# Patient Record
Sex: Male | Born: 1980 | Hispanic: Yes | Marital: Single | Smoking: Current some day smoker
Health system: Southern US, Community
[De-identification: ages and names within clinical notes are randomized; demographics above are authoritative.]

## PROBLEM LIST (undated history)

## (undated) DIAGNOSIS — I10 Essential (primary) hypertension: Secondary | ICD-10-CM

## (undated) DIAGNOSIS — E119 Type 2 diabetes mellitus without complications: Secondary | ICD-10-CM

## (undated) DIAGNOSIS — K859 Acute pancreatitis without necrosis or infection, unspecified: Secondary | ICD-10-CM

## (undated) DIAGNOSIS — E785 Hyperlipidemia, unspecified: Secondary | ICD-10-CM

## (undated) DIAGNOSIS — L409 Psoriasis, unspecified: Secondary | ICD-10-CM

## (undated) HISTORY — DX: Psoriasis, unspecified: L40.9

## (undated) HISTORY — PX: NO PAST SURGERIES: SHX2092

---

## 2008-08-14 ENCOUNTER — Emergency Department: Payer: Self-pay | Admitting: Emergency Medicine

## 2011-06-16 ENCOUNTER — Emergency Department: Payer: Self-pay | Admitting: Internal Medicine

## 2011-06-16 LAB — TROPONIN I: Troponin-I: <0.02 ng/mL

## 2011-06-16 LAB — BASIC METABOLIC PANEL
Anion Gap: 8 (ref 7–16)
Calcium, Total: 9.6 mg/dL (ref 8.5–10.1)
Co2: 25 mmol/L (ref 21–32)
EGFR (African American): >60
EGFR (Non-African Amer.): >60
Osmolality: 279 (ref 275–301)

## 2011-06-16 LAB — CBC
HCT: 48.6 % (ref 40.0–52.0)
HGB: 16.1 g/dL (ref 13.0–18.0)
MCHC: 33.2 g/dL (ref 32.0–36.0)
MCV: 90 fL (ref 80–100)
Platelet: 311 10*3/uL (ref 150–440)
RDW: 13.6 % (ref 11.5–14.5)
WBC: 9.9 10*3/uL (ref 3.8–10.6)

## 2015-10-28 ENCOUNTER — Encounter: Payer: Self-pay | Admitting: *Deleted

## 2015-10-28 ENCOUNTER — Emergency Department
Admission: EM | Admit: 2015-10-28 | Discharge: 2015-10-28 | Disposition: A | Discharge status: Home / Self Care | Payer: Self-pay | Attending: Emergency Medicine | Admitting: Emergency Medicine

## 2015-10-28 DIAGNOSIS — F17200 Nicotine dependence, unspecified, uncomplicated: Secondary | ICD-10-CM | POA: Insufficient documentation

## 2015-10-28 DIAGNOSIS — L089 Local infection of the skin and subcutaneous tissue, unspecified: Secondary | ICD-10-CM | POA: Insufficient documentation

## 2015-10-28 DIAGNOSIS — L409 Psoriasis, unspecified: Secondary | ICD-10-CM | POA: Insufficient documentation

## 2015-10-28 DIAGNOSIS — B9689 Other specified bacterial agents as the cause of diseases classified elsewhere: Secondary | ICD-10-CM

## 2015-10-28 MED ORDER — BETAMETHASONE DIPROPIONATE 0.05 % EX CREA: TOPICAL_CREAM | Freq: Two times a day (BID) | CUTANEOUS | 0 refills | Status: DC

## 2015-10-28 MED ORDER — SULFAMETHOXAZOLE-TRIMETHOPRIM 800-160 MG PO TABS: 1.0000 | ORAL_TABLET | Freq: Two times a day (BID) | ORAL | 0 refills | Status: DC

## 2015-10-28 NOTE — ED Provider Notes (Signed)
[facility] Emergency Department Provider Note  ____________________________________________  Time seen: Approximately 11:21 AM  I have reviewed the triage vital signs and the nursing notes.   HISTORY  Chief Complaint Rash    HPI Patrick Wright is a 35 y.o. male , NAD, presents emergency Department with chronic history of psoriasis. States he has psoriatic plaques about his knees, elbows and fingers. Has had pain about the lesions as they had been cracking with normal ambulation as well as during work at his job. 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 Has had no swelling joints or pain in the joints. Denies fevers, chills, body aches. Has not noted any numbness, weakness, tingling. No injuries or traumas to his extremities.   History reviewed. No pertinent past medical history.  There are no active problems to display for this patient.   History reviewed. No pertinent surgical history.  Prior to Admission medications   Medication Sig Start Date End Date Taking? Authorizing Provider  betamethasone dipropionate (DIPROLENE) 0.05 % cream Apply topically 2 (two) times daily. 10/28/15   Jami L Hagler, PA-C  sulfamethoxazole-trimethoprim (BACTRIM DS,SEPTRA DS) 800-160 MG tablet Take 1 tablet by mouth 2 (two) times daily. 10/28/15   Jami L Hagler, PA-C    Allergies Review of patient's allergies indicates no known allergies.  No family history on file.  Social History Social History  Substance Use Topics  . Smoking status: Current Every Day Smoker  . Smokeless tobacco: Not on file  . Alcohol use Yes     Review of Systems  Constitutional: No fever/chills Musculoskeletal: Negative for general myalgias or joint swelling.  Skin: Positive skin sores and rash elbows, knees, fingers. No oozing or weeping,  bleeding. Neurological: Negative for numbness, weakness, tingling. 10-point ROS otherwise negative.  ____________________________________________   PHYSICAL EXAM:  VITAL SIGNS: ED Triage Vitals  Enc Vitals Group     BP 10/28/15 1042 (!) 136/100     Pulse Rate 10/28/15 1042 69     Resp 10/28/15 1042 18     Temp 10/28/15 1042 98 F (36.7 C)     Temp Source 10/28/15 1042 Oral     SpO2 10/28/15 1042 99 %     Weight 10/28/15 1043 165 lb (74.8 kg)     Height 10/28/15 1043 5' 2"  (1.575 m)     Head Circumference --      Peak Flow --      Pain Score 10/28/15 1043 8     Pain Loc --      Pain Edu? --      Excl. in [place]? --      Constitutional: Alert and oriented. Well appearing and in no acute distress. Eyes: Conjunctivae are normal.  Head: Atraumatic. Cardiovascular: Good peripheral circulation with 2+ pulses noted in bilateral upper and lower extremities. Respiratory: Normal respiratory effort without tachypnea or retractions.  Musculoskeletal: Full range of motion of bilateral upper and lower extremities without pain or difficulty. No swelling about the fingers, hands, knees, elbows. No lower extremity tenderness nor edema.  No joint effusions. Neurologic:  Normal speech and language. No gross focal neurologic deficits are appreciated.  Skin:  Psoriatic plaques noted about the extensor surfaces of bilateral knees, elbows as well as the joints of the fingers. Plaques about the right elbow and bilateral knees are approximately half  a centimeter thick with some centralized cracking with yellow crusted lesion. No active oozing or bleeding. No tenderness to palpation. Surrounding skin is without erythema, abnormal warmth, fluctuance or induration. Skin is warm, dry. Psychiatric: Mood and affect are normal. Speech and behavior are normal. Patient exhibits appropriate insight and judgement.   ____________________________________________    LABS  None ____________________________________________  EKG  None ____________________________________________  RADIOLOGY  None ____________________________________________    PROCEDURES  Procedure(s) performed: None   Procedures   Medications - No data to display   ____________________________________________   INITIAL IMPRESSION / ASSESSMENT AND PLAN / ED COURSE  Pertinent labs & imaging results that were available during my care of the patient were reviewed by me and considered in my medical decision making (see chart for details).  Clinical Course    Patient's diagnosis is consistent with Psoriasis with localized bacterial skin infection. Patient will be discharged home with prescriptions for betamethasone cream and Bactrim DS to take as directed. Patient is to follow-up with [facility] or [facility] dermatology for further evaluation and treatment of chronic psoriasis. It was discussed with patient that it can take a lot of time for psoriasis to decrease in size in thickness and that if he is noncompliant with medical regimens that will only continue to worsen.  Patient is given ED precautions to return to the ED for any worsening or new symptoms.      ____________________________________________  FINAL CLINICAL IMPRESSION(S) / ED DIAGNOSES  Final diagnoses:  Psoriasis  Localized bacterial infection of skin      NEW MEDICATIONS STARTED DURING THIS VISIT:  New Prescriptions   BETAMETHASONE DIPROPIONATE (DIPROLENE) 0.05 % CREAM    Apply topically 2 (two) times daily.   SULFAMETHOXAZOLE-TRIMETHOPRIM (BACTRIM DS,SEPTRA DS) 800-160 MG TABLET    Take 1 tablet by mouth 2 (two) times daily.         Braxton Feathers, PA-C 10/28/15 1138    Earleen [place], MD 10/28/15 1416

## 2015-10-28 NOTE — ED Notes (Signed)
Red scaled patches of skin on bilat elbows and bilat knees and knuckles.  Reports itching and pain.  Has been going on x 1 year. Got a cream from dermatologist 6 months ago but did not have relief. Skin otherwise w/d. Lungs clear.

## 2015-10-28 NOTE — ED Triage Notes (Signed)
Pt has psoriasis on bilateral elbows, hands and knees, pt reports pain in knee and elbow

## 2017-05-23 ENCOUNTER — Other Ambulatory Visit: Payer: Self-pay

## 2017-05-23 ENCOUNTER — Encounter: Payer: Self-pay | Admitting: Emergency Medicine

## 2017-05-23 ENCOUNTER — Inpatient Hospital Stay
Admission: EM | Admit: 2017-05-23 | Discharge: 2017-05-25 | DRG: 638 | Disposition: A | Discharge status: Home / Self Care | Payer: Self-pay | Attending: Internal Medicine | Admitting: Internal Medicine

## 2017-05-23 DIAGNOSIS — Z87891 Personal history of nicotine dependence: Secondary | ICD-10-CM

## 2017-05-23 DIAGNOSIS — E1310 Other specified diabetes mellitus with ketoacidosis without coma: Secondary | ICD-10-CM

## 2017-05-23 DIAGNOSIS — E1110 Type 2 diabetes mellitus with ketoacidosis without coma: Principal | ICD-10-CM

## 2017-05-23 DIAGNOSIS — R748 Abnormal levels of other serum enzymes: Secondary | ICD-10-CM | POA: Diagnosis present

## 2017-05-23 DIAGNOSIS — K7581 Nonalcoholic steatohepatitis (NASH): Secondary | ICD-10-CM | POA: Diagnosis present

## 2017-05-23 DIAGNOSIS — E785 Hyperlipidemia, unspecified: Secondary | ICD-10-CM | POA: Diagnosis present

## 2017-05-23 DIAGNOSIS — E871 Hypo-osmolality and hyponatremia: Secondary | ICD-10-CM | POA: Diagnosis present

## 2017-05-23 DIAGNOSIS — R1084 Generalized abdominal pain: Secondary | ICD-10-CM

## 2017-05-23 DIAGNOSIS — Z720 Tobacco use: Secondary | ICD-10-CM

## 2017-05-23 DIAGNOSIS — R17 Unspecified jaundice: Secondary | ICD-10-CM

## 2017-05-23 HISTORY — DX: Type 2 diabetes mellitus without complications: E11.9

## 2017-05-23 HISTORY — DX: Hyperlipidemia, unspecified: E78.5

## 2017-05-23 LAB — BLOOD GAS, VENOUS
Acid-base deficit: 8.3 mmol/L — ABNORMAL HIGH (ref 0.0–2.0)
Bicarbonate: 17.2 mmol/L — ABNORMAL LOW (ref 20.0–28.0)
O2 Saturation: 51.6 %
PATIENT TEMPERATURE: 37
pCO2, Ven: 35 mmHg — ABNORMAL LOW (ref 44.0–60.0)
pH, Ven: 7.3 (ref 7.250–7.430)

## 2017-05-23 LAB — URINALYSIS, COMPLETE (UACMP) WITH MICROSCOPIC
BACTERIA UA: NONE SEEN
BILIRUBIN URINE: NEGATIVE
Glucose, UA: >=500 mg/dL — AB
HGB URINE DIPSTICK: NEGATIVE
Ketones, ur: 80 mg/dL — AB
LEUKOCYTES UA: NEGATIVE
NITRITE: NEGATIVE
PROTEIN: NEGATIVE mg/dL
RBC / HPF: NONE SEEN RBC/hpf (ref 0–5)
Specific Gravity, Urine: 1.029 (ref 1.005–1.030)
Squamous Epithelial / LPF: NONE SEEN
pH: 5 (ref 5.0–8.0)

## 2017-05-23 LAB — CBC
HEMATOCRIT: 48.3 % (ref 40.0–52.0)
Hemoglobin: 16.5 g/dL (ref 13.0–18.0)
MCH: 31.6 pg (ref 26.0–34.0)
MCHC: 34 g/dL (ref 32.0–36.0)
MCV: 92.8 fL (ref 80.0–100.0)
Platelets: 275 10*3/uL (ref 150–440)
RBC: 5.21 MIL/uL (ref 4.40–5.90)
RDW: 13.6 % (ref 11.5–14.5)
WBC: 8.7 10*3/uL (ref 3.8–10.6)

## 2017-05-23 LAB — GLUCOSE, CAPILLARY
Glucose-Capillary: >600 mg/dL (ref 65–99)
Glucose-Capillary: 365 mg/dL — ABNORMAL HIGH (ref 65–99)
Glucose-Capillary: 353 mg/dL — ABNORMAL HIGH (ref 65–99)
Glucose-Capillary: 281 mg/dL — ABNORMAL HIGH (ref 65–99)

## 2017-05-23 LAB — LIPASE, BLOOD: Lipase: 101 U/L — ABNORMAL HIGH (ref 11–51)

## 2017-05-23 LAB — TROPONIN I

## 2017-05-23 MED ORDER — SODIUM CHLORIDE 0.9 % IV BOLUS
1000.0000 mL | Freq: Once | INTRAVENOUS | Status: AC
  Administered 2017-05-23: 1000 mL via INTRAVENOUS

## 2017-05-23 MED ORDER — HEPARIN SODIUM (PORCINE) 5000 UNIT/ML IJ SOLN: 5000.0000 [IU] | Freq: Three times a day (TID) | INTRAMUSCULAR | Status: DC

## 2017-05-23 MED ORDER — ONDANSETRON HCL 4 MG/2ML IJ SOLN
4.0000 mg | Freq: Once | INTRAMUSCULAR | Status: AC
  Administered 2017-05-23: 4 mg via INTRAVENOUS
  Filled 2017-05-23: qty 2

## 2017-05-23 MED ORDER — MORPHINE SULFATE (PF) 4 MG/ML IV SOLN
4.0000 mg | Freq: Once | INTRAVENOUS | Status: AC
  Administered 2017-05-23: 4 mg via INTRAVENOUS
  Filled 2017-05-23: qty 1

## 2017-05-23 MED ORDER — THIAMINE HCL 100 MG/ML IJ SOLN: 100.0000 mg | Freq: Every day | INTRAMUSCULAR | Status: DC

## 2017-05-23 MED ORDER — SODIUM CHLORIDE 0.9 % IV SOLN
INTRAVENOUS | Status: DC
  Administered 2017-05-23: 5.9 [IU]/h via INTRAVENOUS
  Administered 2017-05-23: 3.1 [IU]/h via INTRAVENOUS
  Filled 2017-05-23: qty 1

## 2017-05-23 MED ORDER — DEXTROSE-NACL 5-0.45 % IV SOLN
INTRAVENOUS | Status: DC
  Administered 2017-05-24: 01:00:00 via INTRAVENOUS

## 2017-05-23 MED ORDER — SODIUM CHLORIDE 0.9 % IV SOLN: INTRAVENOUS | Status: DC

## 2017-05-23 MED ORDER — POTASSIUM CHLORIDE 10 MEQ/100ML IV SOLN
10.0000 meq | INTRAVENOUS | Status: AC
  Administered 2017-05-23 (×2): 10 meq via INTRAVENOUS
  Filled 2017-05-23 (×2): qty 100

## 2017-05-23 MED ORDER — ADULT MULTIVITAMIN W/MINERALS CH
1.0000 | ORAL_TABLET | Freq: Every day | ORAL | Status: DC
  Administered 2017-05-24 – 2017-05-25 (×2): 1 via ORAL
  Filled 2017-05-23 (×2): qty 1

## 2017-05-23 MED ORDER — FOLIC ACID 5 MG/ML IJ SOLN
1.0000 mg | Freq: Every day | INTRAMUSCULAR | Status: DC
  Filled 2017-05-23: qty 0.2

## 2017-05-23 MED ORDER — SODIUM CHLORIDE 0.9 % IV SOLN: INTRAVENOUS | Status: AC

## 2017-05-23 MED ORDER — SODIUM CHLORIDE 0.9 % IV SOLN
INTRAVENOUS | Status: DC
  Administered 2017-05-23: 23:00:00 via INTRAVENOUS

## 2017-05-23 MED ORDER — POTASSIUM CHLORIDE 10 MEQ/100ML IV SOLN
10.0000 meq | INTRAVENOUS | Status: AC
  Administered 2017-05-24 (×2): 10 meq via INTRAVENOUS
  Filled 2017-05-23 (×2): qty 100

## 2017-05-23 MED ORDER — HEPARIN SODIUM (PORCINE) 5000 UNIT/ML IJ SOLN
5000.0000 [IU] | Freq: Three times a day (TID) | INTRAMUSCULAR | Status: DC
  Administered 2017-05-24 – 2017-05-25 (×3): 5000 [IU] via SUBCUTANEOUS
  Filled 2017-05-23 (×2): qty 1

## 2017-05-23 MED ORDER — DEXTROSE-NACL 5-0.45 % IV SOLN
INTRAVENOUS | Status: DC
  Administered 2017-05-23: 21:00:00 via INTRAVENOUS

## 2017-05-23 MED ORDER — DOCUSATE SODIUM 100 MG PO CAPS: 100.0000 mg | ORAL_CAPSULE | Freq: Two times a day (BID) | ORAL | Status: DC | PRN

## 2017-05-23 NOTE — ED Notes (Signed)
Called lab to confirm labs added. They will run

## 2017-05-23 NOTE — ED Triage Notes (Signed)
Pt to ED from home c/o LUQ pain, right ear ache, and left chest pain for several weeks.  States SOB and tingling sensation to entire body.  Vomiting x3 today.  Denies cough.

## 2017-05-23 NOTE — Consult Note (Signed)
Name: Patrick Wright MRN: [id number] DOB: [birth year]    ADMISSION DATE:  05/23/2017 CONSULTATION DATE: 05/23/2017  REFERRING MD : Dr. Anselm Jungling   CHIEF COMPLAINT: Vomiting   BRIEF PATIENT DESCRIPTION: 37 yo male admitted with DKA requiring insulin gtt   SIGNIFICANT EVENTS  04/2-Pt admitted to stepdown unit   STUDIES:  None   HISTORY OF PRESENT ILLNESS:   This is a 37 yo male with a PMH of Hyperlipidemia, ETOH Abuse (drinks 12-24 cans of beer every weekend), Marijuana Abuse, and Diabetes Mellitus.  He presented to [facility] ER on 04/2 with shortness of breath, increased thirst, polyuria. and vomiting onset 3 days prior to presentation.  He also endorsed LUQ pain, right earache, and left sided chest pain for several weeks.  In the ER lab results ruled pt in for DKA, therefore insulin gtt ordered.  The pt does not currently take medications for Diabetes Mellitus.  He was subsequently admitted to the stepdown unit by hospitalist team for further workup and treatment.   PAST MEDICAL HISTORY :   has a past medical history of Diabetes mellitus without complication ([place]) and Hyperlipidemia.  has no past surgical history on file. Prior to Admission medications   Medication Sig Start Date End Date Taking? Authorizing Provider  betamethasone dipropionate (DIPROLENE) 0.05 % cream Apply topically 2 (two) times daily. Patient not taking: Reported on 05/23/2017 10/28/15   Hagler, Jami L, PA-C  sulfamethoxazole-trimethoprim (BACTRIM DS,SEPTRA DS) 800-160 MG tablet Take 1 tablet by mouth 2 (two) times daily. Patient not taking: Reported on 05/23/2017 10/28/15   Hagler, Jami L, PA-C   No Known Allergies  FAMILY HISTORY:  family history includes Diabetes in his father and mother. SOCIAL HISTORY:  reports that he has quit smoking. He has never used smokeless tobacco. He reports that he drinks about 7.2 oz of alcohol per week. He reports that he does not use drugs.  REVIEW OF SYSTEMS: Positives in BOLD    Constitutional: Negative for fever, chills, weight loss, malaise/fatigue and diaphoresis.  HENT: Negative for hearing loss, ear pain, nosebleeds, congestion, sore throat, neck pain, tinnitus and ear discharge.   Eyes: Negative for blurred vision, double vision, photophobia, pain, discharge and redness.  Respiratory: Negative for cough, hemoptysis, sputum production, shortness of breath, wheezing and stridor.   Cardiovascular: left sided chest pain, palpitations, orthopnea, claudication, leg swelling and PND.  Gastrointestinal: heartburn, nausea, vomiting, abdominal pain, diarrhea, constipation, blood in stool and melena.  Genitourinary: dysuria, urgency, frequency, hematuria and flank pain.  Musculoskeletal: Negative for myalgias, back pain, joint pain and falls.  Skin: Negative for itching and rash.  Neurological: Negative for dizziness, tingling, tremors, sensory change, speech change, focal weakness, seizures, loss of consciousness, weakness and headaches.  Endo/Heme/Allergies: environmental allergies and polydipsia. Does not bruise/bleed easily.  SUBJECTIVE:  No complaints currently   VITAL SIGNS: Temp:  [98 F (36.7 C)] 98 F (36.7 C) (04/02 1738) Pulse Rate:  [81-91] 88 (04/02 2030) Resp:  [16-23] 22 (04/02 2030) BP: (104-125)/(76-87) 104/76 (04/02 2000) SpO2:  [98 %-100 %] 98 % (04/02 2030) Weight:  [74.8 kg (165 lb)] 74.8 kg (165 lb) (04/02 1743)  PHYSICAL EXAMINATION: General: well developed, well nourished male, NAD  Neuro: alert and oriented, follows commands HEENT: supple, no JVD Cardiovascular: sinus tach, no M/R/G Lungs: clear throughout, even, non labored  Abdomen: +BS x4, obese, soft, non tender, non distended Musculoskeletal: normal bulk and tone, no edema  Skin: generalized psoriasis   Recent Labs  Lab 05/23/17 1758  NA 129*  K 4.6  CL 86*  CO2 20*  BUN 17  CREATININE 1.04  GLUCOSE 582*   Recent Labs  Lab 05/23/17 1758  HGB 16.5  HCT 48.3  WBC  8.7  PLT 275   No results found.  ASSESSMENT / PLAN: Diabetic Ketoacidosis Hyponatremia in setting of DKA   Transaminitis likely secondary to ETOH Abuse  Elevated alk phose and lipase  HxL Hyperlipidemia, ETOH and Marijuana Abuse  P: Supplemental O2 for hypoxia and/or dyspnea  Continue DKA protocol until anion gap closed and serum CO2 >20 Lipid panel and TSH pending Continuous telemetry monitoring  Trend troponin's  [place] Abd Limited RUQ pending  Keep NPO for now  Trend CMP Replace electrolytes as indicated  Monitor UOP  VTE px: Subq heparin  Trend CBC  Monitor for s/sx of bleeding and transfuse for hgb <7 Trend WBC and monitor fever curve  Diabetes Coordinator and Care Management consulted appreciate input  CIWA protocol  Will add thiamine, mvi, and folic acid Polysubstance abuse and ETOH counseling provided   Marda Stalker, [place] Pager [phone] (please enter 7 digits) PCCM Consult Pager [phone] (please enter 7 digits)

## 2017-05-23 NOTE — H&P (Signed)
[facility] [place] NAME: Patrick Wright    MR#:  [id number]  DATE OF BIRTH:  10-29-1980  DATE OF ADMISSION:  05/23/2017  PRIMARY CARE PHYSICIAN: Patient, No Pcp Per   REQUESTING/REFERRING PHYSICIAN: [place]  CHIEF COMPLAINT:   Chief Complaint  Patient presents with  . Abdominal Pain    HISTORY OF PRESENT ILLNESS: Patrick Wright  is a 37 y.o. male with a known history of Diabetes, hyperlipidemia, does not follow with any doctors and does not take any medications at home.Has been drinking a lot of liquids at home and feeling thirsty all the time, but last 2 weeks it is progressively getting worse but he is drinking excessive liquids including some sugary drinks and water, also feeling very weak and dizzy and nauseated all the time. He is waking up multiple times in the night to go to the bathroom. Concern was all this he finally decided to come to emergency room. Noted to be having diabetic ketoacidosis, started on IV fluid boluses and insulin drip and given to hospitalist team for admission.  PAST MEDICAL HISTORY:   Past Medical History:  Diagnosis Date  . Diabetes mellitus without complication ([place])   . Hyperlipidemia     PAST SURGICAL HISTORY: History reviewed. No pertinent surgical history.  SOCIAL HISTORY:  Social History   Tobacco Use  . Smoking status: Former Research scientist (life sciences)  . Smokeless tobacco: Never Used  Substance Use Topics  . Alcohol use: Yes    Alcohol/week: 7.2 oz    Types: 12 Cans of beer per week    FAMILY HISTORY:  Family History  Problem Relation Age of Onset  . Diabetes Mother   . Diabetes Father     DRUG ALLERGIES: No Known Allergies  REVIEW OF SYSTEMS:   CONSTITUTIONAL: No fever,positive for fatigue or weakness.  EYES: No blurred or double vision.  EARS, NOSE, AND THROAT: No tinnitus or ear pain.  RESPIRATORY: No cough, shortness of breath, wheezing or hemoptysis.  CARDIOVASCULAR: No chest  pain, orthopnea, edema.  GASTROINTESTINAL: he have nausea, vomiting,no diarrhea or abdominal pain.  GENITOURINARY: No dysuria, hematuria.  ENDOCRINE: No polyuria, nocturia,  HEMATOLOGY: No anemia, easy bruising or bleeding SKIN: No rash or lesion. MUSCULOSKELETAL: No joint pain or arthritis.   NEUROLOGIC: No tingling, numbness, weakness.  PSYCHIATRY: No anxiety or depression.   MEDICATIONS AT HOME:  Prior to Admission medications   Medication Sig Start Date End Date Taking? Authorizing Provider  betamethasone dipropionate (DIPROLENE) 0.05 % cream Apply topically 2 (two) times daily. Patient not taking: Reported on 05/23/2017 10/28/15   Hagler, Jami L, PA-C  sulfamethoxazole-trimethoprim (BACTRIM DS,SEPTRA DS) 800-160 MG tablet Take 1 tablet by mouth 2 (two) times daily. Patient not taking: Reported on 05/23/2017 10/28/15   Hagler, Jami L, PA-C      PHYSICAL EXAMINATION:   VITAL SIGNS: Blood pressure 104/76, pulse 88, temperature 98 F (36.7 C), temperature source Oral, resp. rate (!) 22, height 5' (1.524 m), weight 74.8 kg (165 lb), SpO2 98 %.  GENERAL:  37 y.o.-year-old patient lying in the bed with no acute distress.  EYES: Pupils equal, round, reactive to light and accommodation. No scleral icterus. Extraocular muscles intact.  HEENT: Head atraumatic, normocephalic. Oropharynx and nasopharynx clear.  NECK:  Supple, no jugular venous distention. No thyroid enlargement, no tenderness.  LUNGS: Normal breath sounds bilaterally, no wheezing, rales,rhonchi or crepitation. No use of accessory muscles of respiration.  CARDIOVASCULAR: S1, S2 normal. No murmurs,  rubs, or gallops.  ABDOMEN: Soft, nontender, nondistended. Bowel sounds present. No organomegaly or mass.  EXTREMITIES: No pedal edema, cyanosis, or clubbing.  NEUROLOGIC: Cranial nerves II through XII are intact. Muscle strength 5/5 in all extremities. Sensation intact. Gait not checked.  PSYCHIATRIC: The patient is alert and oriented x  3.  SKIN: No obvious rash, lesion, or ulcer.   LABORATORY PANEL:   CBC Recent Labs  Lab 05/23/17 1758  WBC 8.7  HGB 16.5  HCT 48.3  PLT 275  MCV 92.8  MCH 31.6  MCHC 34.0  RDW 13.6   ------------------------------------------------------------------------------------------------------------------  Chemistries  Recent Labs  Lab 05/23/17 1758  NA 129*  K 4.6  CL 86*  CO2 20*  GLUCOSE 582*  BUN 17  CREATININE 1.04  CALCIUM 9.8  AST 42*  ALT 69*  ALKPHOS 151*  BILITOT 1.7*   ------------------------------------------------------------------------------------------------------------------ estimated creatinine clearance is 81.6 mL/min (by C-G formula based on SCr of 1.04 mg/dL). ------------------------------------------------------------------------------------------------------------------ No results for input(s): TSH, T4TOTAL, T3FREE, THYROIDAB in the last 72 hours.  Invalid input(s): FREET3   Coagulation profile No results for input(s): INR, PROTIME in the last 168 hours. ------------------------------------------------------------------------------------------------------------------- No results for input(s): DDIMER in the last 72 hours. -------------------------------------------------------------------------------------------------------------------  Cardiac Enzymes Recent Labs  Lab 05/23/17 1758  TROPONINI <0.03   ------------------------------------------------------------------------------------------------------------------ Invalid input(s): POCBNP  ---------------------------------------------------------------------------------------------------------------  Urinalysis    Component Value Date/Time   COLORURINE STRAW (A) 05/23/2017 1750   APPEARANCEUR CLEAR (A) 05/23/2017 1750   LABSPEC 1.029 05/23/2017 1750   PHURINE 5.0 05/23/2017 1750   GLUCOSEU >=500 (A) 05/23/2017 1750   HGBUR NEGATIVE 05/23/2017 1750   BILIRUBINUR NEGATIVE 05/23/2017  1750   KETONESUR 80 (A) 05/23/2017 1750   PROTEINUR NEGATIVE 05/23/2017 1750   NITRITE NEGATIVE 05/23/2017 1750   LEUKOCYTESUR NEGATIVE 05/23/2017 1750     RADIOLOGY: No results found.  EKG: Orders placed or performed during the hospital encounter of 05/23/17  . ED EKG  . ED EKG    IMPRESSION AND PLAN:  * diabetic ketoacidosis   Admit to stepdown unit with IV insulin drip and electrolyte and IV fluid management as per ICU DKA protocol.  he may need help set up follow-up appointments and prescriptions at the time of discharge.  * hyponatremia   Due to DKA, continue monitoring with IV fluid.  * Elevated Lipase   Continue IV fluids for now, check triglycerides and keep nothing by mouth.  * Elevated bilirubin and alkaline phosphatase   Check right upper quadrant ultrasound, keep nothing by mouth.   All the records are reviewed and case discussed with ED provider. Management plans discussed with the patient, family and they are in agreement.  CODE STATUS:Full code  His wife is present in the room during my visit.  TOTAL TIME TAKING CARE OF THIS PATIENT: 50 critical care minutes.    Vaughan Basta M.D on 05/23/2017   Between 7am to 6pm - Pager - [phone]  After 6pm go to [email or web address] - password [email or web address] [facility]  Office  [phone]  CC: Primary care physician; Patient, No Pcp Per   Note: This dictation was prepared with Dragon dictation along with smaller phrase technology. Any transcriptional errors that result from this process are unintentional.

## 2017-05-23 NOTE — ED Notes (Signed)
ED Provider at bedside with interpreter

## 2017-05-23 NOTE — ED Provider Notes (Signed)
[facility] Emergency Department Provider Note  ____________________________________________   First MD Initiated Contact with Patient 05/23/17 1757     (approximate)  I have reviewed the triage vital signs and the nursing notes.   HISTORY  Chief Complaint Abdominal Pain  Interpreter, Hiram, present for interaction.   HPI Patrick Wright is a 36 y.o. male with a history of diabetes as well as hyperlipidemia who is presenting to the emergency department 2 days of burning to his abdomen as well as nausea, vomiting excessive thirst and urination.  He says that he was diagnosed with diabetes 15 years ago in [place] but is never taken any medication for this condition.  He says that recently he has been drinking a lot of sugary drinks and also on the weekends will have 12-24 beers on 1 of the days.  He says the abdominal pain is to his left upper quadrant and is a burning type pain that radiates up into his chest.  He also reports weight loss over the past several weeks.  Unsure of the exact amount but says that he is noticeably thinner.  Past Medical History:  Diagnosis Date  . Diabetes mellitus without complication ([place])   . Hyperlipidemia     There are no active problems to display for this patient.   History reviewed. No pertinent surgical history.  Prior to Admission medications   Medication Sig Start Date End Date Taking? Authorizing Provider  betamethasone dipropionate (DIPROLENE) 0.05 % cream Apply topically 2 (two) times daily. Patient not taking: Reported on 05/23/2017 10/28/15   Hagler, Jami L, PA-C  sulfamethoxazole-trimethoprim (BACTRIM DS,SEPTRA DS) 800-160 MG tablet Take 1 tablet by mouth 2 (two) times daily. Patient not taking: Reported on 05/23/2017 10/28/15   Hagler, Wende Crease L, PA-C    Allergies Patient has no known allergies.  History reviewed. No pertinent family history.  Social History Social History   Tobacco Use  . Smoking status:  Former Research scientist (life sciences)  . Smokeless tobacco: Never Used  Substance Use Topics  . Alcohol use: Yes    Alcohol/week: 7.2 oz    Types: 12 Cans of beer per week  . Drug use: Never    Review of Systems  Constitutional: No fever/chills Eyes: No visual changes. ENT: No sore throat. Cardiovascular: As above Respiratory: Denies shortness of breath. Gastrointestinal:   No diarrhea.  No constipation. Genitourinary: Negative for dysuria. Musculoskeletal: Negative for back pain. Skin: Negative for rash. Neurological: Negative for headaches, focal weakness or numbness.   ____________________________________________   PHYSICAL EXAM:  VITAL SIGNS: ED Triage Vitals  Enc Vitals Group     BP 05/23/17 1738 124/85     Pulse Rate 05/23/17 1738 91     Resp 05/23/17 1738 16     Temp 05/23/17 1738 98 F (36.7 C)     Temp Source 05/23/17 1738 Oral     SpO2 05/23/17 1738 99 %     Weight 05/23/17 1743 165 lb (74.8 kg)     Height 05/23/17 1743 5' (1.524 m)     Head Circumference --      Peak Flow --      Pain Score 05/23/17 1742 8     Pain Loc --      Pain Edu? --      Excl. in [place]? --     Constitutional: Alert and oriented. Well appearing and in no acute distress. Eyes: Conjunctivae are normal.  Head: Atraumatic. Nose: No congestion/rhinnorhea. Mouth/Throat: Mucous membranes are dry  Neck: No stridor.   Cardiovascular: Tachycardic to 104, regular rhythm. Grossly normal heart sounds.   Respiratory: Normal respiratory effort.  No retractions. Lungs CTAB. Gastrointestinal: Soft with moderate and diffuse tenderness to palpation throughout the abdomen without rebound or guarding.  No distention. No CVA tenderness. Musculoskeletal: No lower extremity tenderness nor edema.  No joint effusions. Neurologic:  Normal speech and language. No gross focal neurologic deficits are appreciated. Skin:  Skin is warm, dry and intact. No rash noted. Psychiatric: Mood and affect are normal. Speech and behavior are  normal.  ____________________________________________   LABS (all labs ordered are listed, but only abnormal results are displayed)  Labs Reviewed  LIPASE, BLOOD - Abnormal; Notable for the following components:      Result Value   Lipase 101 (*)    All other components within normal limits  COMPREHENSIVE METABOLIC PANEL - Abnormal; Notable for the following components:   Sodium 129 (*)    Chloride 86 (*)    CO2 20 (*)    Glucose, Bld 582 (*)    Total Protein 8.3 (*)    AST 42 (*)    ALT 69 (*)    Alkaline Phosphatase 151 (*)    Total Bilirubin 1.7 (*)    Anion gap 23 (*)    All other components within normal limits  URINALYSIS, COMPLETE (UACMP) WITH MICROSCOPIC - Abnormal; Notable for the following components:   Color, Urine STRAW (*)    APPearance CLEAR (*)    Glucose, UA >=500 (*)    Ketones, ur 80 (*)    All other components within normal limits  GLUCOSE, CAPILLARY - Abnormal; Notable for the following components:   Glucose-Capillary >600 (*)    All other components within normal limits  BLOOD GAS, VENOUS - Abnormal; Notable for the following components:   pCO2, Ven 35 (*)    Bicarbonate 17.2 (*)    Acid-base deficit 8.3 (*)    All other components within normal limits  CBC  TROPONIN I   ____________________________________________  EKG  ED ECG REPORT I, Doran Stabler, the attending physician, personally viewed and interpreted this ECG.   Date: 05/23/2017  EKG Time: 1751  Rate: 98  Rhythm: normal sinus rhythm  Axis: Normal  Intervals:Prolonged QT at 364 with a QTC of 464  ST&T Change: No ST segment elevation or depression.  Deep T wave inversion in V2 with biphasic T waves in V3 which are worse from previous.  Last EKG in 2013.  ____________________________________________  RADIOLOGY   ____________________________________________   PROCEDURES  Procedure(s) performed:   .Critical Care Performed by: Orbie Pyo, MD Authorized  by: Orbie Pyo, MD   Critical care provider statement:    Critical care time (minutes):  35   Critical care was necessary to treat or prevent imminent or life-threatening deterioration of the following conditions:  Metabolic crisis   Critical care was time spent personally by me on the following activities:  Examination of patient, ordering and performing treatments and interventions, ordering and review of laboratory studies and re-evaluation of patient's condition   Critical Care performed:  ____________________________________________   INITIAL IMPRESSION / ASSESSMENT AND PLAN / ED COURSE  Pertinent labs & imaging results that were available during my care of the patient were reviewed by me and considered in my medical decision making (see chart for details).  DDX: Diabetic ketoacidosis Differential diagnosis includes, but is not limited to, biliary disease (biliary colic, acute cholecystitis, cholangitis, choledocholithiasis, etc), intrathoracic causes  for epigastric abdominal pain including ACS, gastritis, duodenitis, pancreatitis, small bowel or large bowel obstruction, abdominal aortic aneurysm, hernia, and gastritis. As part of my medical decision making, I reviewed the following data within the [place] EKG reviewed, Notes from prior ED visits and [place] Controlled Substance Database  ----------------------------------------- 7:24 PM on 05/23/2017 -----------------------------------------  Patient found to be in DKA with an anion gap of 23.  We will start an insulin drip.  The patient will be admitted to the hospital.  Signed out to Dr. Anselm Jungling.  Patient aware of treatment plan and willing to comply.  Abdominal pain likely related to DKA.  Do not suspect acute abdomen. ____________________________________________   FINAL CLINICAL IMPRESSION(S) / ED DIAGNOSES  DKA.    NEW MEDICATIONS STARTED DURING THIS VISIT:  New Prescriptions   No  medications on file     Note:  This document was prepared using Dragon voice recognition software and may include unintentional dictation errors.     Orbie Pyo, MD 05/23/17 [phone]

## 2017-05-24 ENCOUNTER — Inpatient Hospital Stay: Payer: Self-pay

## 2017-05-24 LAB — BASIC METABOLIC PANEL
Anion gap: 12 (ref 5–15)
Anion gap: 8 (ref 5–15)
BUN: 13 mg/dL (ref 6–20)
BUN: 13 mg/dL (ref 6–20)
CALCIUM: 8 mg/dL — AB (ref 8.9–10.3)
CO2: 20 mmol/L — ABNORMAL LOW (ref 22–32)
CO2: 22 mmol/L (ref 22–32)
CREATININE: 0.67 mg/dL (ref 0.61–1.24)
Calcium: 8.1 mg/dL — ABNORMAL LOW (ref 8.9–10.3)
Chloride: 103 mmol/L (ref 101–111)
Chloride: 105 mmol/L (ref 101–111)
Creatinine, Ser: 0.56 mg/dL — ABNORMAL LOW (ref 0.61–1.24)
GFR calc Af Amer: >60 mL/min (ref >60)
GFR calc non Af Amer: >60 mL/min (ref >60)
GLUCOSE: 165 mg/dL — AB (ref 65–99)
Glucose, Bld: 265 mg/dL — ABNORMAL HIGH (ref 65–99)
POTASSIUM: 3.2 mmol/L — AB (ref 3.5–5.1)
POTASSIUM: 3.6 mmol/L (ref 3.5–5.1)
SODIUM: 135 mmol/L (ref 135–145)
Sodium: 135 mmol/L (ref 135–145)

## 2017-05-24 LAB — CBC
HEMATOCRIT: 37.7 % — AB (ref 40.0–52.0)
Hemoglobin: 13.1 g/dL (ref 13.0–18.0)
MCH: 30.8 pg (ref 26.0–34.0)
MCHC: 34.7 g/dL (ref 32.0–36.0)
MCV: 88.7 fL (ref 80.0–100.0)
Platelets: 200 10*3/uL (ref 150–440)
RBC: 4.25 MIL/uL — ABNORMAL LOW (ref 4.40–5.90)
RDW: 13.5 % (ref 11.5–14.5)
WBC: 8.8 10*3/uL (ref 3.8–10.6)

## 2017-05-24 LAB — GLUCOSE, CAPILLARY
GLUCOSE-CAPILLARY: 123 mg/dL — AB (ref 65–99)
GLUCOSE-CAPILLARY: 101 mg/dL — AB (ref 65–99)
GLUCOSE-CAPILLARY: 210 mg/dL — AB (ref 65–99)
GLUCOSE-CAPILLARY: 238 mg/dL — AB (ref 65–99)
GLUCOSE-CAPILLARY: 265 mg/dL — AB (ref 65–99)
Glucose-Capillary: 171 mg/dL — ABNORMAL HIGH (ref 65–99)
Glucose-Capillary: 172 mg/dL — ABNORMAL HIGH (ref 65–99)
Glucose-Capillary: 221 mg/dL — ABNORMAL HIGH (ref 65–99)
Glucose-Capillary: 377 mg/dL — ABNORMAL HIGH (ref 65–99)
Glucose-Capillary: 478 mg/dL — ABNORMAL HIGH (ref 65–99)

## 2017-05-24 LAB — TSH: TSH: 1.748 u[IU]/mL (ref 0.350–4.500)

## 2017-05-24 LAB — LIPID PANEL
Cholesterol: 325 mg/dL — ABNORMAL HIGH (ref 0–200)
LDL CALC: UNDETERMINED mg/dL (ref 0–99)
Triglycerides: 1462 mg/dL — ABNORMAL HIGH (ref <150)
VLDL: UNDETERMINED mg/dL (ref 0–40)

## 2017-05-24 LAB — HEMOGLOBIN A1C
HEMOGLOBIN A1C: 16.6 % — AB (ref 4.8–5.6)
Mean Plasma Glucose: 429.72 mg/dL

## 2017-05-24 LAB — MRSA PCR SCREENING: MRSA BY PCR: NEGATIVE

## 2017-05-24 MED ORDER — LIVING WELL WITH DIABETES BOOK - IN SPANISH
Freq: Once | Status: AC
  Administered 2017-05-24: 14:00:00
  Filled 2017-05-24: qty 1

## 2017-05-24 MED ORDER — INSULIN ASPART PROT & ASPART (70-30 MIX) 100 UNIT/ML ~~LOC~~ SUSP
10.0000 [IU] | Freq: Two times a day (BID) | SUBCUTANEOUS | Status: DC
Start: 2017-05-24 — End: 2017-05-25
  Administered 2017-05-24: 17:00:00 10 [IU] via SUBCUTANEOUS
  Filled 2017-05-24: qty 1

## 2017-05-24 MED ORDER — INSULIN GLARGINE 100 UNIT/ML ~~LOC~~ SOLN
10.0000 [IU] | SUBCUTANEOUS | Status: DC
  Administered 2017-05-24: 10 [IU] via SUBCUTANEOUS
  Filled 2017-05-24: qty 0.1

## 2017-05-24 MED ORDER — INSULIN GLARGINE 100 UNIT/ML ~~LOC~~ SOLN
25.0000 [IU] | SUBCUTANEOUS | Status: DC
  Filled 2017-05-24: qty 0.25

## 2017-05-24 MED ORDER — INSULIN ASPART 100 UNIT/ML ~~LOC~~ SOLN
5.0000 [IU] | Freq: Three times a day (TID) | SUBCUTANEOUS | Status: DC
  Administered 2017-05-24 – 2017-05-25 (×2): 5 [IU] via SUBCUTANEOUS
  Filled 2017-05-24 (×2): qty 1

## 2017-05-24 MED ORDER — INSULIN ASPART 100 UNIT/ML ~~LOC~~ SOLN
25.0000 [IU] | Freq: Once | SUBCUTANEOUS | Status: AC
  Administered 2017-05-24: 13:00:00 25 [IU] via SUBCUTANEOUS
  Filled 2017-05-24: qty 1

## 2017-05-24 MED ORDER — INSULIN STARTER KIT- SYRINGES (SPANISH)
1.0000 | Freq: Once | Status: AC
  Administered 2017-05-24: 22:00:00 1
  Filled 2017-05-24 (×2): qty 1

## 2017-05-24 MED ORDER — VITAMIN B-1 100 MG PO TABS
100.0000 mg | ORAL_TABLET | Freq: Every day | ORAL | Status: DC
  Administered 2017-05-24 – 2017-05-25 (×2): 100 mg via ORAL
  Filled 2017-05-24 (×2): qty 1

## 2017-05-24 MED ORDER — FOLIC ACID 1 MG PO TABS
1.0000 mg | ORAL_TABLET | Freq: Every day | ORAL | Status: DC
  Administered 2017-05-24 – 2017-05-25 (×2): 1 mg via ORAL
  Filled 2017-05-24 (×2): qty 1

## 2017-05-24 MED ORDER — INSULIN ASPART 100 UNIT/ML ~~LOC~~ SOLN
0.0000 [IU] | Freq: Three times a day (TID) | SUBCUTANEOUS | Status: DC
Start: 2017-05-24 — End: 2017-05-25
  Administered 2017-05-24 – 2017-05-25 (×3): 5 [IU] via SUBCUTANEOUS
  Filled 2017-05-24 (×3): qty 1

## 2017-05-24 MED ORDER — INSULIN ASPART 100 UNIT/ML ~~LOC~~ SOLN
0.0000 [IU] | Freq: Every day | SUBCUTANEOUS | Status: DC
Start: 2017-05-24 — End: 2017-05-25
  Administered 2017-05-24: 21:00:00 3 [IU] via SUBCUTANEOUS
  Filled 2017-05-24: qty 1

## 2017-05-24 MED ORDER — POTASSIUM CHLORIDE 10 MEQ/100ML IV SOLN
10.0000 meq | INTRAVENOUS | Status: AC
  Administered 2017-05-24 (×4): 10 meq via INTRAVENOUS
  Filled 2017-05-24 (×4): qty 100

## 2017-05-24 MED ORDER — INSULIN ASPART 100 UNIT/ML ~~LOC~~ SOLN: 0.0000 [IU] | SUBCUTANEOUS | Status: DC

## 2017-05-24 MED ORDER — ONDANSETRON HCL 4 MG/2ML IJ SOLN: 4.0000 mg | Freq: Four times a day (QID) | INTRAMUSCULAR | Status: DC | PRN

## 2017-05-24 NOTE — Care Management (Signed)
RNCM met with patient in ICU.  He has limited [place] and currently under CIWA protocol. He seemed lethargic when I met with him. I explained the Spanish version/application as best as I could. He agreed and said "thank you".  He depends on a friend for transportation.  He said he has a glucometer but couldn't tell me how often he use it.  RNCM to continue to follow.  Referral to [facility].

## 2017-05-24 NOTE — Plan of Care (Signed)
  RD identified need for nutrition education regarding diabetes during initial assessment.  Lab Results  Component Value Date   HGBA1C 16.6 (H) 05/24/2017    RD provided "Carbohydrate Counting for People with Diabetes" handout in Spanish from the Academy of Nutrition and Dietetics. Discussed different food groups and their effects on blood sugar, emphasizing carbohydrate-containing foods. Provided list of carbohydrates and recommended serving sizes of common foods.  Discussed importance of controlled and consistent carbohydrate intake throughout the day. Provided examples of ways to balance meals/snacks and encouraged intake of high-fiber, whole grain complex carbohydrates. Teach back method used.  Expect fair compliance.  Body mass index is 24.13 kg/m. Pt meets criteria for normal weight based on current BMI.  Current diet order is Carbohydrate Modified, patient is consuming approximately 100% of meals at this time.  Patrick Blade, MS, [place], LDN Office: [phone] Pager: [phone] After Hours/Weekend Pager: [phone]

## 2017-05-24 NOTE — Progress Notes (Signed)
Interpreter present and helped educate patient about Diabetes and ways to control it. Patient filled out an application with interpreter for [facility], interpreter stated she would submit it for him. Patient did his own finger sticks and insulin administration today, after education, tolerated well but was hesitant. Will continue to educate and monitor. Dorna Bloom, LPN

## 2017-05-24 NOTE — Progress Notes (Signed)
Chaplain responded to an OR for an AD. Chaplain got a interpreter and educated on AD. Pt wanted an underage person to be POA. The pt will wait regarding assignment.    05/24/17 1300  Clinical Encounter Type  Visited With Patient  Visit Type Initial  Referral From Nurse  Spiritual Encounters  Spiritual Needs Brochure;Other (Comment)

## 2017-05-24 NOTE — Progress Notes (Signed)
Initial Nutrition Assessment  DOCUMENTATION CODES:   Not applicable  INTERVENTION:  Provided education on DM (note to follow).  Encouraged adequate intake of protein at meals.  NUTRITION DIAGNOSIS:   Food and nutrition related knowledge deficit related to limited prior education as evidenced by per patient/family report.  GOAL:   Patient will meet greater than or equal to 90% of their needs  MONITOR:   PO intake, Labs, Weight trends, I & O's  REASON FOR ASSESSMENT:   Malnutrition Screening Tool    ASSESSMENT:   37 year old male with PMHx of DM type 2, HLD who was admitted with DKA, acute pseudohyponatremia.   Met with patient at bedside with assistance from [facility] interpreter Lacretia Leigh). Family also came in at end of assessment and some of his family members can speak [place]. Patient reports he has had a decreased appetite for the past 2 weeks. He has been very thirsty so he was drinking more water than usual, which caused a feeling of satiety. He was also experiencing N/V and blurry vision. He reports he is feeling better now and his appetite has returned to normal. For breakfast he usually has juice and banana. For lunch and dinner he usually has some type of protein (pork, fried chicken, beef) and sides/vegetables. At work he eats a lot of fast food. He reports he has many family members that have diabetes so he is familiar with management. He also reports he was diagnosed with diabetes 15 years ago but never started on any medication for it.  Patient is unsure of his UBW but feels he has been losing weight because his clothes are more loose now. Only other weight in chart was 165 lbs from 10/28/2015, but that looks like a stated weight and not a true measured weight.  Meal Completion: 100%  Medications reviewed and include: folic acid 1 mg daily, Novolog 0-15 units TID, Novolog 0-5 units QHS, Novolog 5 units TID with meals, Novolog Mix 70/30 10 units BID, MVI daily,  thiamine 100 mg daily.  Labs reviewed: CBG 101->600 past 24 hrs, HgbA1c 16.6, Potassium 3.2, Creatinine 0.56.  Patient does not meet criteria for malnutrition.  Discussed with RN.  NUTRITION - FOCUSED PHYSICAL EXAM:    Most Recent Value  Orbital Region  No depletion  Upper Arm Region  No depletion  Thoracic and Lumbar Region  No depletion  Buccal Region  No depletion  Temple Region  No depletion  Clavicle Bone Region  No depletion  Clavicle and Acromion Bone Region  No depletion  Scapular Bone Region  No depletion  Dorsal Hand  No depletion  Patellar Region  No depletion  Anterior Thigh Region  No depletion  Posterior Calf Region  No depletion  Edema (RD Assessment)  None  Hair  Reviewed  Eyes  Reviewed  Mouth  Reviewed  Skin  Reviewed  Nails  Reviewed     Diet Order:  Diet Carb Modified Fluid consistency: Thin; Room service appropriate? Yes  EDUCATION NEEDS:   Education needs have been addressed  Skin:  Skin Assessment: Reviewed RN Assessment  Last BM:  PTA (05/21/2017 per chart)  Height:   Ht Readings from Last 1 Encounters:  05/23/17 5' 3"  (1.6 m)    Weight:   Wt Readings from Last 1 Encounters:  05/23/17 136 lb 3.9 oz (61.8 kg)    Ideal Body Weight:  56.4 kg  BMI:  Body mass index is 24.13 kg/m.  Estimated Nutritional Needs:   Kcal:  2595-6387 (MSJ x 1.2-1.3)  Protein:  75-85 grams (1.2-1.4 grams/kg)  Fluid:  1.8-2.1 L/day (30-35 mL/kg)  Willey Blade, MS, RD, LDN Office: [phone] Pager: [phone] After Hours/Weekend Pager: [phone]

## 2017-05-24 NOTE — Progress Notes (Signed)
Inpatient Diabetes Program Recommendations  AACE/ADA: New Consensus Statement on Inpatient Glycemic Control (2015)  Target Ranges:  Prepandial:   less than 140 mg/dL      Peak postprandial:   less than 180 mg/dL (1-2 hours)      Critically ill patients:  140 - 180 mg/dL   Lab Results  Component Value Date   GLUCAP 377 (H) 05/24/2017   HGBA1C 16.6 (H) 05/24/2017    Review of Glycemic ControlResults for MONTAVIS, SCHUBRING (MRN [id number]) as of 05/24/2017 14:08  Ref. Range 05/24/2017 03:25 05/24/2017 04:42 05/24/2017 07:44 05/24/2017 12:21 05/24/2017 12:25  Glucose-Capillary Latest Ref Range: 65 - 99 mg/dL 123 (H) 101 (H) 210 (H) 478 (H) 377 (H)    Diabetes history: Type 2- New onset Outpatient Diabetes medications: None Current orders for Inpatient glycemic control:  Novolog moderate tid with meals and HS, Lantus 25 units q 24 hours  Inpatient Diabetes Program Recommendations:    Talked with patient regarding diabetes. Patient states that he was told 15 years ago that he had DM however he has never taken any medications for DM.  I discussed A1C results with patient and explained that blood sugars have been averaging>400 mg/dL.  Patient asked if he would need pills or insulin at home.  I explained that based on his A1C, he will need insulin.  He states that he has already been practicing with RN on giving insulin. We also discussed checking blood sugars and him practicing.  We discussed normal blood sugar values of 80-120 mg/dL and that they should not go greater than 200 mg/dL.  Patient does read in Spanish and was already reading the Spanish Living well with DM booklet.  We discussed what DM is and how insulin works.  We further discussed the long term complications of DM and why it is important to control blood sugars. Patient states that he was feeling dizzy, had vision changes and was going to the bathroom more prior to admit.  We then reviewed the symptoms of low blood sugar.  Patient was able to  correctly teach back how to treat low blood sugars stating "I will drink or eat something with sugar"- I clarified further that he would need to drink 1/2 cup of regular soda or juice to treat low blood sugar.  Patient smiled and nodded his head.  He seemed very engaged.  Blood sugars have increased back to >400 mg/dL since coming off the insulin drip.  He likely needs basal plus CHO coverage so consider Novolin 70/30 12 units bid (at d/c)-can be purchased at [facility] for 24.88$ per vial.    Will follow up on 4/4- Appreciate assistance of interpreter.    Thanks  Adah Perl, RN, BC-ADM Inpatient Diabetes Coordinator Pager [phone] (8a-5p)

## 2017-05-24 NOTE — Progress Notes (Addendum)
Inpatient Diabetes Program Recommendations  AACE/ADA: New Consensus Statement on Inpatient Glycemic Control (2015)  Target Ranges:  Prepandial:   less than 140 mg/dL      Peak postprandial:   less than 180 mg/dL (1-2 hours)      Critically ill patients:  140 - 180 mg/dL   Lab Results  Component Value Date   GLUCAP 210 (H) 05/24/2017   HGBA1C 16.6 (H) 05/24/2017    Review of Glycemic ControlResults for SHAD, LEDVINA (MRN [id number]) as of 05/24/2017 09:50  Ref. Range 05/24/2017 04:42 05/24/2017 07:44  Glucose-Capillary Latest Ref Range: 65 - 99 mg/dL 101 (H) 210 (H)    Diabetes history: DM- no home medications listed? Current orders for Inpatient glycemic control:  Lantus 10 units daily, Novolog moderate tid with meals and HS  Inpatient Diabetes Program Recommendations:    Note that patient admitted with DKA.  A1C indicates average blood sugars approximately 438 mg/dL.  He will need insulin at d/c and it appears that he does not have medication insurance.  May consider d/c of Lantus and add Novolog 70/30 10 units bid.  At d/c consider Novolin 70/30- Relion brand since it can be purchased at [facility] for 24.88$ a vial. This will likely need to be titrated up based on blood sugar values.  Will attempt to speak with patient today regarding DM and A1C.    Thanks,  Adah Perl, RN, BC-ADM Inpatient Diabetes Coordinator Pager [phone] (8a-5p)

## 2017-05-24 NOTE — Progress Notes (Signed)
[place] [facility] [place] NAME: Patrick Wright    MR#:  [id number]  DATE OF BIRTH:  04-07-1980  SUBJECTIVE:  CHIEF COMPLAINT:   Chief Complaint  Patient presents with  . Abdominal Pain  No complaints, doing better, in discussion with intensivist-to floor later today  REVIEW OF SYSTEMS:  CONSTITUTIONAL: No fever, fatigue or weakness.  EYES: No blurred or double vision.  EARS, NOSE, AND THROAT: No tinnitus or ear pain.  RESPIRATORY: No cough, shortness of breath, wheezing or hemoptysis.  CARDIOVASCULAR: No chest pain, orthopnea, edema.  GASTROINTESTINAL: No nausea, vomiting, diarrhea or abdominal pain.  GENITOURINARY: No dysuria, hematuria.  ENDOCRINE: No polyuria, nocturia,  HEMATOLOGY: No anemia, easy bruising or bleeding SKIN: No rash or lesion. MUSCULOSKELETAL: No joint pain or arthritis.   NEUROLOGIC: No tingling, numbness, weakness.  PSYCHIATRY: No anxiety or depression.   ROS  DRUG ALLERGIES:  No Known Allergies  VITALS:  Blood pressure 115/70, pulse (!) 107, temperature 99.1 F (37.3 C), temperature source Oral, resp. rate (!) 24, height 5' 3"  (1.6 m), weight 61.8 kg (136 lb 3.9 oz), SpO2 98 %.  PHYSICAL EXAMINATION:  GENERAL:  37 y.o.-year-old patient lying in the bed with no acute distress.  EYES: Pupils equal, round, reactive to light and accommodation. No scleral icterus. Extraocular muscles intact.  HEENT: Head atraumatic, normocephalic. Oropharynx and nasopharynx clear.  NECK:  Supple, no jugular venous distention. No thyroid enlargement, no tenderness.  LUNGS: Normal breath sounds bilaterally, no wheezing, rales,rhonchi or crepitation. No use of accessory muscles of respiration.  CARDIOVASCULAR: S1, S2 normal. No murmurs, rubs, or gallops.  ABDOMEN: Soft, nontender, nondistended. Bowel sounds present. No organomegaly or mass.  EXTREMITIES: No pedal edema, cyanosis, or clubbing.  NEUROLOGIC: Cranial nerves II through  XII are intact. Muscle strength 5/5 in all extremities. Sensation intact. Gait not checked.  PSYCHIATRIC: The patient is alert and oriented x 3.  SKIN: No obvious rash, lesion, or ulcer.   Physical Exam LABORATORY PANEL:   CBC Recent Labs  Lab 05/24/17 0207  WBC 8.8  HGB 13.1  HCT 37.7*  PLT 200   ------------------------------------------------------------------------------------------------------------------  Chemistries  Recent Labs  Lab 05/23/17 1758  05/24/17 0207  NA 129*   < > 135  K 4.6   < > 3.2*  CL 86*   < > 105  CO2 20*   < > 22  GLUCOSE 582*   < > 165*  BUN 17   < > 13  CREATININE 1.04   < > 0.56*  CALCIUM 9.8   < > 8.0*  AST 42*  --   --   ALT 69*  --   --   ALKPHOS 151*  --   --   BILITOT 1.7*  --   --    < > = values in this interval not displayed.   ------------------------------------------------------------------------------------------------------------------  Cardiac Enzymes Recent Labs  Lab 05/23/17 1758  TROPONINI <0.03   ------------------------------------------------------------------------------------------------------------------  RADIOLOGY:  US Abdomen Limited Ruq  Result Date: 05/24/2017 CLINICAL DATA:  Elevated bilirubin. EXAM: ULTRASOUND ABDOMEN LIMITED RIGHT UPPER QUADRANT COMPARISON:  None. FINDINGS: Gallbladder: No gallstones or wall thickening visualized. No sonographic Murphy sign noted by sonographer. Common bile duct: Diameter: 2 mm common normal Liver: Diffusely echogenic liver suggesting steatosis. Some areas of more normal echogenicity/focal sparing adjacent to the gallbladder fossa. Portal vein is patent on color Doppler imaging with normal direction of blood flow towards the liver. IMPRESSION: Echogenic liver suggesting steatosis.  No evidence gallstones or ductal disease. Electronically Signed   By: Nelson Chimes M.D.   On: 05/24/2017 10:00    ASSESSMENT AND PLAN:  1 acute DKA Resolved In discussion with intensivist-to  floor later today Diabetic educator consulted while in house-start Novolin 70/30-12 units twice daily, sliding scale insulin with Accu-Cheks per routine  2 acute pseudohyponatremia  Resolving with IV fluids for rehydration   3 acute elevated lipase  Most likely secondary to above   4 acute elevated bilirubin/alkaline phosphatase Most likely secondary to steatohepatitis noted on liver ultrasound Avoid hepatotoxic agents  Disposition home on tomorrow barring any complications   All the records are reviewed and case discussed with Care Management/Social Workerr. Management plans discussed with the patient, family and they are in agreement.  CODE STATUS: full  TOTAL TIME TAKING CARE OF THIS PATIENT: 35 minutes.     POSSIBLE D/C IN 1 DAYS, DEPENDING ON CLINICAL CONDITION.   [name] M.D on 05/24/2017   Between 7am to 6pm - Pager - [phone]  After 6pm go to [email or web address] - password [email or web address] [facility]  Office  [phone]  CC: Primary care physician; Patient, No Pcp Per  Note: This dictation was prepared with Dragon dictation along with smaller phrase technology. Any transcriptional errors that result from this process are unintentional.

## 2017-05-24 NOTE — Progress Notes (Signed)
   05/24/17 0200  CIWA-Ar  BP 102/62  Pulse Rate 85  Nausea and Vomiting 0  Tremor 0  Paroxysmal Sweats 0  Anxiety 0  Agitation 0   Difficult to obtain accurate CIWA assessment score based upon the language barrier

## 2017-05-25 LAB — BASIC METABOLIC PANEL
Anion gap: 9 (ref 5–15)
BUN: 10 mg/dL (ref 6–20)
CALCIUM: 8.4 mg/dL — AB (ref 8.9–10.3)
CHLORIDE: 102 mmol/L (ref 101–111)
CO2: 24 mmol/L (ref 22–32)
CREATININE: 0.61 mg/dL (ref 0.61–1.24)
GFR calc non Af Amer: >60 mL/min (ref >60)
GLUCOSE: 241 mg/dL — AB (ref 65–99)
Potassium: 3.4 mmol/L — ABNORMAL LOW (ref 3.5–5.1)
Sodium: 135 mmol/L (ref 135–145)

## 2017-05-25 LAB — LIPID PANEL
Cholesterol: 191 mg/dL (ref 0–200)
HDL: 34 mg/dL — ABNORMAL LOW (ref >40)
LDL Cholesterol: 108 mg/dL — ABNORMAL HIGH (ref 0–99)
Total CHOL/HDL Ratio: 5.6 RATIO
Triglycerides: 245 mg/dL — ABNORMAL HIGH (ref <150)
VLDL: 49 mg/dL — AB (ref 0–40)

## 2017-05-25 LAB — COMPREHENSIVE METABOLIC PANEL
ALBUMIN: 4.6 g/dL (ref 3.5–5.0)
ALT: 69 U/L — ABNORMAL HIGH (ref 17–63)
AST: 42 U/L — ABNORMAL HIGH (ref 15–41)
Alkaline Phosphatase: 151 U/L — ABNORMAL HIGH (ref 38–126)
Anion gap: 23 — ABNORMAL HIGH (ref 5–15)
BILIRUBIN TOTAL: 1.7 mg/dL — AB (ref 0.3–1.2)
BUN: 17 mg/dL (ref 6–20)
CO2: 20 mmol/L — ABNORMAL LOW (ref 22–32)
Calcium: 9.8 mg/dL (ref 8.9–10.3)
Chloride: 86 mmol/L — ABNORMAL LOW (ref 101–111)
Creatinine, Ser: 1.04 mg/dL (ref 0.61–1.24)
GFR calc Af Amer: >60 mL/min (ref >60)
GLUCOSE: 582 mg/dL — AB (ref 65–99)
POTASSIUM: 4.6 mmol/L (ref 3.5–5.1)
Sodium: 129 mmol/L — ABNORMAL LOW (ref 135–145)
TOTAL PROTEIN: 8.3 g/dL — AB (ref 6.5–8.1)

## 2017-05-25 LAB — LIPASE, BLOOD: LIPASE: 45 U/L (ref 11–51)

## 2017-05-25 LAB — HEPATIC FUNCTION PANEL
ALK PHOS: 88 U/L (ref 38–126)
ALT: 38 U/L (ref 17–63)
AST: 31 U/L (ref 15–41)
Albumin: 3.3 g/dL — ABNORMAL LOW (ref 3.5–5.0)
BILIRUBIN DIRECT: 0.1 mg/dL (ref 0.1–0.5)
BILIRUBIN TOTAL: 1.2 mg/dL (ref 0.3–1.2)
Indirect Bilirubin: 1.1 mg/dL — ABNORMAL HIGH (ref 0.3–0.9)
Total Protein: 6.5 g/dL (ref 6.5–8.1)

## 2017-05-25 LAB — GLUCOSE, CAPILLARY: GLUCOSE-CAPILLARY: 238 mg/dL — AB (ref 65–99)

## 2017-05-25 LAB — HIV ANTIBODY (ROUTINE TESTING W REFLEX): HIV SCREEN 4TH GENERATION: NONREACTIVE

## 2017-05-25 MED ORDER — POTASSIUM CHLORIDE CRYS ER 20 MEQ PO TBCR
40.0000 meq | EXTENDED_RELEASE_TABLET | Freq: Two times a day (BID) | ORAL | Status: DC
  Administered 2017-05-25: 09:00:00 40 meq via ORAL
  Filled 2017-05-25: qty 2

## 2017-05-25 MED ORDER — BLOOD GLUCOSE MONITOR KIT: PACK | 0 refills | Status: DC

## 2017-05-25 MED ORDER — INSULIN ASPART PROT & ASPART (70-30 MIX) 100 UNIT/ML ~~LOC~~ SUSP
15.0000 [IU] | Freq: Two times a day (BID) | SUBCUTANEOUS | Status: DC
  Administered 2017-05-25: 15 [IU] via SUBCUTANEOUS
  Filled 2017-05-25: qty 1

## 2017-05-25 MED ORDER — ATORVASTATIN CALCIUM 20 MG PO TABS: 40.0000 mg | ORAL_TABLET | Freq: Every day | ORAL | Status: DC

## 2017-05-25 MED ORDER — ATORVASTATIN CALCIUM 40 MG PO TABS: 40.0000 mg | ORAL_TABLET | Freq: Every day | ORAL | 2 refills | Status: DC

## 2017-05-25 MED ORDER — INSULIN ASPART PROT & ASPART (70-30 MIX) 100 UNIT/ML ~~LOC~~ SUSP: 15.0000 [IU] | Freq: Two times a day (BID) | SUBCUTANEOUS | 11 refills | Status: DC

## 2017-05-25 NOTE — Discharge Instructions (Signed)
Keep record of blood sugar daily. Follow with PMD in 2-3 weeks.

## 2017-05-25 NOTE — Progress Notes (Signed)
Visited with patient with interpreter.  He states that he feels good about the education he has received.  I demonstrated drawing up insulin with patient using Normal saline.  He returned demonstration and did a great job.  We discussed disposal of insulin syringes and needles as well in hard plastic container and then throw away.  We also discussed storage of insulin at room temperature once opened for 28 days.  Patient verbalized understanding.  Asked patient about low blood sugar again and he was able to teach back correctly how to treat a low blood sugar.  He states that his friend has diabetes as well and that they will help him.  He will get his medications from the medication management clinic.  Interpreter reminded patient of this as well.    Thanks,  Adah Perl, RN, BC-ADM Inpatient Diabetes Coordinator Pager [phone] (8a-5p)

## 2017-05-25 NOTE — Discharge Summary (Signed)
[facility] NAME: Patrick Wright    MR#:  [id number]  DATE OF BIRTH:  16-Aug-1980  DATE OF ADMISSION:  05/23/2017 ADMITTING PHYSICIAN: Vaughan Basta, MD  DATE OF DISCHARGE: 05/25/2017   PRIMARY CARE PHYSICIAN: Patient, No Pcp Per    ADMISSION DIAGNOSIS:  Generalized abdominal pain [R10.84] Elevated bilirubin [R17] Diabetic ketoacidosis without coma associated with other specified diabetes mellitus ([place]) [E13.10]  DISCHARGE DIAGNOSIS:  Principal Problem:   DKA (diabetic ketoacidoses) ([place]) Active Problems:   Diabetic keto-acidosis ([place])   SECONDARY DIAGNOSIS:   Past Medical History:  Diagnosis Date  . Diabetes mellitus without complication ([place])   . Hyperlipidemia     HOSPITAL COURSE:   1 acute DKA Resolved  Diabetic educator consulted while in house-start Novolin 70/30-15 units twice daily, sliding scale insulin with Accu-Cheks per routine  Blood sugar is better controlled now. Advised on daily 2-3 times blood sugar check after discharge and follow with PMD in 2-3 weeks.  2 acute pseudohyponatremia  Resolving with IV fluids for rehydration   3 acute elevated lipase  Most likely secondary to above - resolved.  4 acute elevated bilirubin/alkaline phosphatase Most likely secondary to steatohepatitis noted on liver ultrasound Avoid hepatotoxic agents  5. Hyperlipidemia   Better with good blood sugar control.   Atorvastatin on discharge.     DISCHARGE CONDITIONS:   Stable.  CONSULTS OBTAINED:    DRUG ALLERGIES:  No Known Allergies  DISCHARGE MEDICATIONS:   Allergies as of 05/25/2017   No Known Allergies     Medication List    STOP taking these medications   betamethasone dipropionate 0.05 % cream Commonly known as:  DIPROLENE   sulfamethoxazole-trimethoprim 800-160 MG tablet Commonly known as:  BACTRIM DS,SEPTRA DS     TAKE these medications   atorvastatin 40 MG  tablet Commonly known as:  LIPITOR Take 1 tablet (40 mg total) by mouth daily at 6 PM.   blood glucose meter kit and supplies Kit Dispense based on patient and insurance preference. Use up to four times daily as directed. (FOR ICD-9 250.00, 250.01).   insulin aspart protamine- aspart (70-30) 100 UNIT/ML injection Commonly known as:  NOVOLOG MIX 70/30 Inject 0.15 mLs (15 Units total) into the skin 2 (two) times daily with a meal.        DISCHARGE INSTRUCTIONS:    Follow with PMD in 2-3 weeks.  If you experience worsening of your admission symptoms, develop shortness of breath, life threatening emergency, suicidal or homicidal thoughts you must seek medical attention immediately by calling 911 or calling your MD immediately  if symptoms less severe.  You Must read complete instructions/literature along with all the possible adverse reactions/side effects for all the Medicines you take and that have been prescribed to you. Take any new Medicines after you have completely understood and accept all the possible adverse reactions/side effects.   Please note  You were cared for by a hospitalist during your hospital stay. If you have any questions about your discharge medications or the care you received while you were in the hospital after you are discharged, you can call the unit and asked to speak with the hospitalist on call if the hospitalist that took care of you is not available. Once you are discharged, your primary care physician will handle any further medical issues. Please note that NO REFILLS for any discharge medications will be authorized once you are discharged, as it is imperative that  you return to your primary care physician (or establish a relationship with a primary care physician if you do not have one) for your aftercare needs so that they can reassess your need for medications and monitor your lab values.    Today   CHIEF COMPLAINT:   Chief Complaint  Patient  presents with  . Abdominal Pain    HISTORY OF PRESENT ILLNESS:  Patrick Wright  is a 37 y.o. male with a known history of Diabetes, hyperlipidemia, does not follow with any doctors and does not take any medications at home.Has been drinking a lot of liquids at home and feeling thirsty all the time, but last 2 weeks it is progressively getting worse but he is drinking excessive liquids including some sugary drinks and water, also feeling very weak and dizzy and nauseated all the time. He is waking up multiple times in the night to go to the bathroom. Concern was all this he finally decided to come to emergency room. Noted to be having diabetic ketoacidosis, started on IV fluid boluses and insulin drip and given to hospitalist team for admission.   VITAL SIGNS:  Blood pressure 105/73, pulse 83, temperature 98.2 F (36.8 C), temperature source Oral, resp. rate 18, height 5' 3"  (1.6 m), weight 61.8 kg (136 lb 3.9 oz), SpO2 99 %.  I/O:    Intake/Output Summary (Last 24 hours) at 05/25/2017 0958 Last data filed at 05/24/2017 1700 Gross per 24 hour  Intake 395.66 ml  Output -  Net 395.66 ml    PHYSICAL EXAMINATION:  GENERAL:  37 y.o.-year-old patient lying in the bed with no acute distress.  EYES: Pupils equal, round, reactive to light and accommodation. No scleral icterus. Extraocular muscles intact.  HEENT: Head atraumatic, normocephalic. Oropharynx and nasopharynx clear.  NECK:  Supple, no jugular venous distention. No thyroid enlargement, no tenderness.  LUNGS: Normal breath sounds bilaterally, no wheezing, rales,rhonchi or crepitation. No use of accessory muscles of respiration.  CARDIOVASCULAR: S1, S2 normal. No murmurs, rubs, or gallops.  ABDOMEN: Soft, non-tender, non-distended. Bowel sounds present. No organomegaly or mass.  EXTREMITIES: No pedal edema, cyanosis, or clubbing.  NEUROLOGIC: Cranial nerves II through XII are intact. Muscle strength 5/5 in all extremities. Sensation  intact. Gait not checked.  PSYCHIATRIC: The patient is alert and oriented x 3.  SKIN: No obvious rash, lesion, or ulcer. Some chronic changes of psoriasis.  DATA REVIEW:   CBC Recent Labs  Lab 05/24/17 0207  WBC 8.8  HGB 13.1  HCT 37.7*  PLT 200    Chemistries  Recent Labs  Lab 05/25/17 0704  NA 135  K 3.4*  CL 102  CO2 24  GLUCOSE 241*  BUN 10  CREATININE 0.61  CALCIUM 8.4*  AST 31  ALT 38  ALKPHOS 88  BILITOT 1.2    Cardiac Enzymes Recent Labs  Lab 05/23/17 1758  TROPONINI <0.03    Microbiology Results  Results for orders placed or performed during the hospital encounter of 05/23/17  MRSA PCR Screening     Status: None   Collection Time: 05/23/17 10:45 PM  Result Value Ref Range Status   MRSA by PCR NEGATIVE NEGATIVE Final    Comment:        The GeneXpert MRSA Assay (FDA approved for NASAL specimens only), is one component of a comprehensive MRSA colonization surveillance program. It is not intended to diagnose MRSA infection nor to guide or monitor treatment for MRSA infections. Performed at [facility], [place]  [address], [place], [place] [address]     RADIOLOGY:  US Abdomen Limited Ruq  Result Date: 05/24/2017 CLINICAL DATA:  Elevated bilirubin. EXAM: ULTRASOUND ABDOMEN LIMITED RIGHT UPPER QUADRANT COMPARISON:  None. FINDINGS: Gallbladder: No gallstones or wall thickening visualized. No sonographic Murphy sign noted by sonographer. Common bile duct: Diameter: 2 mm common normal Liver: Diffusely echogenic liver suggesting steatosis. Some areas of more normal echogenicity/focal sparing adjacent to the gallbladder fossa. Portal vein is patent on color Doppler imaging with normal direction of blood flow towards the liver. IMPRESSION: Echogenic liver suggesting steatosis. No evidence gallstones or ductal disease. Electronically Signed   By: Nelson Chimes M.D.   On: 05/24/2017 10:00    EKG:   Orders placed or performed during the hospital  encounter of 05/23/17  . ED EKG  . ED EKG      Management plans discussed with the patient, family and they are in agreement.  CODE STATUS:     Code Status Orders  (From admission, onward)        Start     Ordered   05/23/17 2237  Full code  Continuous     05/23/17 2236    Code Status History    Date Active Date Inactive Code Status Order ID Comments User Context   05/23/2017 2237 05/23/2017 2237 Full Code [id number]  Vaughan Basta, MD Inpatient      TOTAL TIME TAKING CARE OF THIS PATIENT: 35 minutes.    Vaughan Basta M.D on 05/25/2017 at 9:58 AM  Between 7am to 6pm - Pager - [phone]  After 6pm go to [email or web address] - password [email or web address] [facility]  Office  [phone]  CC: Primary care physician; Patient, No Pcp Per   Note: This dictation was prepared with Dragon dictation along with smaller phrase technology. Any transcriptional errors that result from this process are unintentional.

## 2017-05-25 NOTE — Progress Notes (Signed)
Inpatient Diabetes Program Recommendations  AACE/ADA: New Consensus Statement on Inpatient Glycemic Control (2015)  Target Ranges:  Prepandial:   less than 140 mg/dL      Peak postprandial:   less than 180 mg/dL (1-2 hours)      Critically ill patients:  140 - 180 mg/dL   Lab Results  Component Value Date   GLUCAP 238 (H) 05/25/2017   HGBA1C 16.6 (H) 05/24/2017    Review of Glycemic ControlResults for TANDRE, CONLY (MRN [id number]) as of 05/25/2017 08:48  Ref. Range 05/24/2017 12:25 05/24/2017 16:37 05/24/2017 20:32 05/25/2017 08:13  Glucose-Capillary Latest Ref Range: 65 - 99 mg/dL 377 (H) 238 (H) 265 (H) 238 (H)    Diabetes history: New onset DM Outpatient Diabetes medications: None Current orders for Inpatient glycemic control:  Novolog moderate tid with meals and HS, Novolog 5 units tid with meals, Novolog 70/30 mix 15 units bid  Inpatient Diabetes Program Recommendations:    Note that since patient is on 70/30 mix, he will not need Novolog meal coverage 5 units.  Upon D/C, please make sure that script is for Novolin Reli-on 70/30 since this is a less expensive insulin option (can be purchased at [facility] for 24.88$ a vial).  He will also need Rx. For glucose meter (which hopefully he can get from medication management clinic).  Will follow-up with patient today.   Thanks,  Adah Perl, RN, BC-ADM Inpatient Diabetes Coordinator Pager [phone] (8a-5p)

## 2017-05-30 ENCOUNTER — Ambulatory Visit: Payer: Self-pay | Admitting: Family Medicine

## 2017-05-30 VITALS — BP 108/71 | HR 84 | Temp 97.9°F | Wt 147.1 lb

## 2017-05-30 DIAGNOSIS — E0865 Diabetes mellitus due to underlying condition with hyperglycemia: Secondary | ICD-10-CM

## 2017-05-30 MED ORDER — TRIAMCINOLONE ACETONIDE 0.1 % EX CREA: 1.0000 "application " | TOPICAL_CREAM | Freq: Two times a day (BID) | CUTANEOUS | 0 refills | Status: DC

## 2017-05-30 MED ORDER — INSULIN ASPART PROT & ASPART (70-30 MIX) 100 UNIT/ML ~~LOC~~ SUSP: SUBCUTANEOUS | 11 refills | Status: DC

## 2017-05-30 NOTE — Patient Instructions (Addendum)
Patrick Wright will call you to find out how your sugars are doing and contact one of the providers Monitor your sugars three times a day Call if sugars are staying over 400 If your sugars drop under 100, decrease your insulin by 5 units each dose

## 2017-05-30 NOTE — Progress Notes (Signed)
BP 108/71 (BP Location: Left Arm) Comment (Patient Position): Sitting  Pulse 84   Temp 97.9 F (36.6 C) (Oral)   Wt 147 lb 1.6 oz (66.7 kg)   BMI 26.06 kg/m    Subjective:    Patient ID: Patrick Wright, male    DOB: 09/02/1980, 37 y.o.   MRN: [id number]  HPI: Patrick Wright is a 37 y.o. male  Chief Complaint  Patient presents with  . Establish Care    self-logged glucose levels have stayed between 400-500 range. patient has been watching diet and is taking insulin. He gets a stomach pain and gets dizzy when he takes his atorvastatin  . Establish Care    patient has been having blurry vision since starting medications    HPI Patient is here to establish care He was recently diagnosed with diabetes; just hospitalized in April He is checking his sugars, running 400-500 every day Last A1c 16.6 He was in DKA; blood sugar was over 600 His TG were 1462, total chol 325; 2 days laster TG dropped from 1462 to 245  He is currently having abdominal pain, dizziness, confusion, dry mouth, not urinating frequently Last shot of insulin was this morning; 15 units New rash that just started yesterday; worse with sweating  These last few days, the insulin has helped the sugars come down "I feel fine" Diagnosed with diabetes about 15 years ago; when he was first diagnosed with diabetes, he was NOT treated with insulin; he was treated with pills originally and went for years without treatment Morning is just coffee, midday is the bigger meal; then he eats an evening meal  No flowsheet data found.  Relevant past medical, surgical, family and social history reviewed Past Medical History:  Diagnosis Date  . Diabetes mellitus without complication ([place])   . Hyperlipidemia   . Psoriasis    No past surgical history on file. Family History  Problem Relation Age of Onset  . Diabetes Mother   . Diabetes Brother   . Diabetes Daughter    Social History   Tobacco Use  . Smoking  status: Former Smoker    Packs/day: 2.00    Types: Cigarettes    Start date: 1992    Last attempt to quit: 2018    Years since quitting: 1.3  . Smokeless tobacco: Never Used  Substance Use Topics  . Alcohol use: Yes    Alcohol/week: 7.2 oz    Types: 12 Cans of beer per week    Comment: has not drank since hospitalization  . Drug use: Not Currently    Comment: has not taken drugs since hospitalization    Interim medical history since last visit reviewed. Allergies and medications reviewed  Review of Systems Per HPI unless specifically indicated above     Objective:    BP 108/71 (BP Location: Left Arm) Comment (Patient Position): Sitting  Pulse 84   Temp 97.9 F (36.6 C) (Oral)   Wt 147 lb 1.6 oz (66.7 kg)   BMI 26.06 kg/m    Physical Exam  Constitutional: He appears well-developed and well-nourished. No distress.  HENT:  Head: Normocephalic and atraumatic.  Eyes: EOM are normal. No scleral icterus.  Neck: No thyromegaly present.  Cardiovascular: Normal rate and regular rhythm.  Pulmonary/Chest: Effort normal and breath sounds normal.  Abdominal: Soft. Bowel sounds are normal. He exhibits no distension.  Musculoskeletal: He exhibits no edema.  Neurological: Coordination normal.  Skin: Skin is warm and dry. No pallor.  Psychiatric:  He has a normal mood and affect. His behavior is normal. Judgment and thought content normal.      Assessment & Plan:   Problem List Items Addressed This Visit      Endocrine   Diabetes mellitus due to underlying condition, uncontrolled, with hyperglycemia ([place]) - Primary    Patient does not wish to go to the hospital; working with interpreter, we will have him increase his insulin tonight, and talk with her tomorrow about his sugars; continue to titrate insulin as needed to get sugars controlled, fasting under 150 without hypoglycemic episodes; healthy eating encouraged; check sugars 3x a day; to ER if s/s of ketoacidosis      Relevant  Medications   insulin aspart protamine- aspart (NOVOLOG MIX 70/30) (70-30) 100 UNIT/ML injection   Other Relevant Orders   Ambulatory referral to diabetic education       Follow up plan: Return in about 2 weeks (around 06/13/2017) for follow-up with provider.  An after-visit summary was printed and given to the patient at [place].  Please see the patient instructions which may contain other information and recommendations beyond what is mentioned above in the assessment and plan.  Meds ordered this encounter  Medications  . insulin aspart protamine- aspart (NOVOLOG MIX 70/30) (70-30) 100 UNIT/ML injection    Sig: 25 units with breakfast and 20 units with evening meals; 25 unidades con el desayuno y 20 unidades con la cena    Dispense:  10 mL    Refill:  11  . triamcinolone cream (KENALOG) 0.1 %    Sig: Apply 1 application topically 2 (two) times daily.    Dispense:  80 g    Refill:  0    Orders Placed This Encounter  Procedures  . Ambulatory referral to diabetic education

## 2017-06-01 ENCOUNTER — Telehealth: Payer: Self-pay

## 2017-06-01 NOTE — Telephone Encounter (Signed)
EMMI Follow-up: Report noted patient wasn't sure who to contact if changes in his condition.  I called and asked to speak to patient.  He wasn't able to understand me even though Call Language was noted as English so he put a family member on the phone to talk with me. I asked if he was aware of follow-up appointment with [facility] on 4/23 at 7 pm and stated yes.  Asked if he had any other concerns, none noted at this time.

## 2017-06-12 DIAGNOSIS — E0865 Diabetes mellitus due to underlying condition with hyperglycemia: Secondary | ICD-10-CM | POA: Insufficient documentation

## 2017-06-12 NOTE — Assessment & Plan Note (Signed)
Patient does not wish to go to the hospital; working with interpreter, we will have him increase his insulin tonight, and talk with her tomorrow about his sugars; continue to titrate insulin as needed to get sugars controlled, fasting under 150 without hypoglycemic episodes; healthy eating encouraged; check sugars 3x a day; to ER if s/s of ketoacidosis

## 2017-06-13 ENCOUNTER — Ambulatory Visit: Payer: Self-pay | Admitting: Adult Health Nurse Practitioner

## 2017-06-13 VITALS — BP 115/71 | HR 89 | Wt 153.6 lb

## 2017-06-13 DIAGNOSIS — E0865 Diabetes mellitus due to underlying condition with hyperglycemia: Secondary | ICD-10-CM

## 2017-06-13 MED ORDER — INSULIN ASPART PROT & ASPART (70-30 MIX) 100 UNIT/ML ~~LOC~~ SUSP: SUBCUTANEOUS | 11 refills | Status: DC

## 2017-06-13 MED ORDER — METFORMIN HCL 500 MG PO TABS: 500.0000 mg | ORAL_TABLET | Freq: Two times a day (BID) | ORAL | 3 refills | Status: DC

## 2017-06-13 NOTE — Progress Notes (Signed)
  Patient: Patrick Wright Male    DOB: [birth year]   37 y.o.   MRN: [id number] Visit Date: 06/13/2017  Today's Provider: Staci Acosta, NP   Chief Complaint  Patient presents with  . Chest Pain    2 hrs of left sided chest pain  . Blurred Vision    needs eye exam  . Follow-up    insulin refill sent to home   Subjective:    HPI  Last A1c was 16.6 Checking CBGs TID- running 150-250.  States that when CBG is low he does not use the entire 25 units- he will take 10-15 units.  Taking insulin as prescribed.  Endorses hypoglycemia- after eating.  No reports of CP currently.    No Known Allergies Previous Medications   ATORVASTATIN (LIPITOR) 40 MG TABLET    Take 1 tablet (40 mg total) by mouth daily at 6 PM.   BLOOD GLUCOSE METER KIT AND SUPPLIES KIT    Dispense based on patient and insurance preference. Use up to four times daily as directed. (FOR ICD-9 250.00, 250.01).   INSULIN ASPART PROTAMINE- ASPART (NOVOLOG MIX 70/30) (70-30) 100 UNIT/ML INJECTION    25 units with breakfast and 20 units with evening meals; 25 unidades con el desayuno y 20 unidades con la cena   TRIAMCINOLONE CREAM (KENALOG) 0.1 %    Apply 1 application topically 2 (two) times daily.    Review of Systems  All other systems reviewed and are negative.   Social History   Tobacco Use  . Smoking status: Former Smoker    Packs/day: 2.00    Types: Cigarettes    Start date: 1992    Last attempt to quit: 2018    Years since quitting: 1.3  . Smokeless tobacco: Never Used  Substance Use Topics  . Alcohol use: Yes    Alcohol/week: 7.2 oz    Types: 12 Cans of beer per week    Comment: has not drank since hospitalization   Objective:   BP 115/71   Pulse 89   Wt 153 lb 9.6 oz (69.7 kg)   BMI 27.21 kg/m   Physical Exam  Constitutional: He appears well-developed and well-nourished.  Cardiovascular: Normal rate, regular rhythm and normal heart sounds.  Pulmonary/Chest: Effort normal and breath  sounds normal.  Abdominal: Soft. Bowel sounds are normal.        Assessment & Plan:         Will refer to eye clinic.  Will decrease Novolog 70/30 to 20 QAM and 20QPM- reduce to 10 units if CBG <100.  Add Metformin 515m BID.   FU in 2 months.  Labs a week prior.   Monitor for recurrent unrelieved CP, hospital precautions given.   TStaci Acosta NP   [facility]

## 2017-06-21 ENCOUNTER — Ambulatory Visit: Payer: Self-pay | Admitting: Ophthalmology

## 2017-06-23 ENCOUNTER — Ambulatory Visit: Payer: Self-pay | Admitting: *Deleted

## 2017-06-27 ENCOUNTER — Telehealth: Payer: Self-pay | Admitting: Pharmacy Technician

## 2017-06-27 NOTE — Telephone Encounter (Signed)
Provided patient with new patient packet (in Spanish) to obtain [facility] services.  Patient understands that [facility] must receive 2019 financial documentation in order to determine eligibility.  Interpretation provided by Spanish speaking Interpreter from [employer].  [facility]

## 2017-07-12 ENCOUNTER — Ambulatory Visit: Payer: Self-pay | Admitting: Pharmacy Technician

## 2017-07-12 ENCOUNTER — Encounter (INDEPENDENT_AMBULATORY_CARE_PROVIDER_SITE_OTHER): Payer: Self-pay

## 2017-07-12 DIAGNOSIS — Z79899 Other long term (current) drug therapy: Secondary | ICD-10-CM

## 2017-07-12 NOTE — Progress Notes (Signed)
  Patient speaks Spanish.  Interpretation provided by Spanish speaking interpreter-Maritiza Afanador.  Completed [facility] application and contract.  Patient agreed to all terms of the [facility] contract.  Patient is undocumented.  Explained to patient that some PAP medications could not be obtained from some of the pharmaceutical companies.  Patient acknowledged that he understood.    Patient approved to receive medication assistance from [facility] through 2019, as long as eligibility criteria continues to be met.    [facility]

## 2017-07-13 ENCOUNTER — Other Ambulatory Visit: Payer: Self-pay | Admitting: Adult Health Nurse Practitioner

## 2017-07-13 MED ORDER — INSULIN NPH ISOPHANE & REGULAR (70-30) 100 UNIT/ML ~~LOC~~ SUSP: SUBCUTANEOUS | 11 refills | Status: DC

## 2017-07-14 ENCOUNTER — Ambulatory Visit: Payer: Self-pay | Admitting: *Deleted

## 2017-08-08 ENCOUNTER — Other Ambulatory Visit: Payer: Self-pay | Admitting: Endocrinology

## 2017-08-08 DIAGNOSIS — E0865 Diabetes mellitus due to underlying condition with hyperglycemia: Secondary | ICD-10-CM

## 2017-08-08 NOTE — Progress Notes (Signed)
Labs only

## 2017-08-09 LAB — COMPREHENSIVE METABOLIC PANEL
ALT: 24 IU/L (ref 0–44)
AST: 27 IU/L (ref 0–40)
Albumin/Globulin Ratio: 1.9 (ref 1.2–2.2)
Albumin: 5.5 g/dL (ref 3.5–5.5)
Alkaline Phosphatase: 85 IU/L (ref 39–117)
BUN/Creatinine Ratio: 21 — ABNORMAL HIGH (ref 9–20)
BUN: 20 mg/dL (ref 6–20)
Bilirubin Total: 0.7 mg/dL (ref 0.0–1.2)
CALCIUM: 9.9 mg/dL (ref 8.7–10.2)
CHLORIDE: 98 mmol/L (ref 96–106)
CO2: 23 mmol/L (ref 20–29)
Creatinine, Ser: 0.97 mg/dL (ref 0.76–1.27)
GFR calc Af Amer: 114 mL/min/{1.73_m2} (ref >59)
GFR, EST NON AFRICAN AMERICAN: 99 mL/min/{1.73_m2} (ref >59)
GLUCOSE: 107 mg/dL — AB (ref 65–99)
Globulin, Total: 2.9 g/dL (ref 1.5–4.5)
Potassium: 3.7 mmol/L (ref 3.5–5.2)
Sodium: 137 mmol/L (ref 134–144)
TOTAL PROTEIN: 8.4 g/dL (ref 6.0–8.5)

## 2017-08-09 LAB — HEMOGLOBIN A1C
ESTIMATED AVERAGE GLUCOSE: 160 mg/dL
HEMOGLOBIN A1C: 7.2 % — AB (ref 4.8–5.6)

## 2017-08-15 ENCOUNTER — Ambulatory Visit: Payer: Self-pay | Admitting: Family Medicine

## 2017-08-15 VITALS — BP 133/84 | HR 99 | Temp 99.1°F | Ht 62.21 in | Wt 155.6 lb

## 2017-08-15 DIAGNOSIS — E119 Type 2 diabetes mellitus without complications: Secondary | ICD-10-CM

## 2017-08-15 DIAGNOSIS — Z09 Encounter for follow-up examination after completed treatment for conditions other than malignant neoplasm: Secondary | ICD-10-CM

## 2017-08-15 MED ORDER — ATORVASTATIN CALCIUM 40 MG PO TABS: 40.0000 mg | ORAL_TABLET | Freq: Every day | ORAL | 2 refills | Status: DC

## 2017-08-15 NOTE — Progress Notes (Addendum)
   Subjective:    Patient ID: Patrick Wright, male    DOB: 1980-11-23, 37 y.o.   MRN: [id number]   PCP: Kathe Becton, NP  Chief Complaint  Patient presents with  . Labs Only    was told to come review labs    HPI  Patrick Wright has a history of Psoriasis, Hyperlipidemia, and Diabetes.   Current Status: He is doing well with no complaints. He denies fevers, chills, fatigue, recent infections, weight loss, and night sweats.   He has not had any headaches, visual changes, dizziness, and falls.   No chest pain, heart palpitations, cough and shortness of breath reported.   No reports of GI problems such as nausea, vomiting, diarrhea, and constipation. He has no reports of blood in stools, dysuria and hematuria.   No depression or anxiety.   He has no pain today.   Past Medical History:  Diagnosis Date  . Diabetes mellitus without complication ([place])   . Hyperlipidemia   . Psoriasis     Family History  Problem Relation Age of Onset  . Diabetes Mother   . Diabetes Brother   . Diabetes Daughter     Review of Systems  Constitutional: Negative.   HENT: Negative.   Eyes: Negative.   Respiratory: Negative.   Cardiovascular: Negative.   Gastrointestinal: Negative.   Endocrine: Negative.   Genitourinary: Negative.   Musculoskeletal: Negative.   Skin: Negative.        Psoriasis in palms of hands.  Allergic/Immunologic: Negative.   Neurological: Negative.   Hematological: Negative.   Psychiatric/Behavioral: Negative.    Objective:   Physical Exam  Constitutional: He is oriented to person, place, and time. He appears well-developed and well-nourished.  HENT:  Head: Normocephalic and atraumatic.  Right Ear: External ear normal.  Left Ear: External ear normal.  Nose: Nose normal.  Mouth/Throat: Oropharynx is clear and moist.  Eyes: Pupils are equal, round, and reactive to light. Conjunctivae and EOM are normal.  Neck: Normal range of motion. Neck supple.   Cardiovascular: Normal rate, regular rhythm, normal heart sounds and intact distal pulses.  Pulmonary/Chest: Effort normal and breath sounds normal.  Abdominal: Soft. Bowel sounds are normal.  Musculoskeletal: Normal range of motion.  Neurological: He is alert and oriented to person, place, and time.  Skin: Skin is warm and dry. Capillary refill takes less than 2 seconds.  Psychiatric: He has a normal mood and affect. His behavior is normal. Judgment and thought content normal.  Nursing note and vitals reviewed.  Assessment & Plan:   1. Diabetes mellitus without complication ([place]) He is doing very well. Hgb A1c is decreased at 7.2 on 08/08/2017, from 16.6 on 05/24/2017. He continues to modify his diet by decreasing high calories, high-sugar, and low-fat foods. He will continue to increase his daily activities.    2. Follow up He will follow up in 3 months.   Meds ordered this encounter  Medications  . atorvastatin (LIPITOR) 40 MG tablet    Sig: Take 1 tablet (40 mg total) by mouth daily at 6 PM.    Dispense:  30 tablet    Refill:  2    Kathe Becton,  MSN, FNP-BC [facility] [address] [place], [place] [address] [phone]

## 2017-11-07 ENCOUNTER — Other Ambulatory Visit: Payer: Self-pay

## 2017-11-14 ENCOUNTER — Ambulatory Visit: Payer: Self-pay

## 2017-11-22 ENCOUNTER — Telehealth: Payer: Self-pay | Admitting: Ophthalmology

## 2017-11-22 NOTE — Telephone Encounter (Signed)
Called pt to reschedule eye appointment for [facility] 12/27/17. Left message for pt to return phone call at their earliest convenience.

## 2017-12-27 ENCOUNTER — Ambulatory Visit: Payer: Self-pay | Admitting: Ophthalmology

## 2018-04-03 ENCOUNTER — Emergency Department
Admission: EM | Admit: 2018-04-03 | Discharge: 2018-04-03 | Disposition: A | Discharge status: Home / Self Care | Payer: Self-pay | Attending: Emergency Medicine | Admitting: Emergency Medicine

## 2018-04-03 ENCOUNTER — Emergency Department: Payer: Self-pay

## 2018-04-03 ENCOUNTER — Other Ambulatory Visit: Payer: Self-pay

## 2018-04-03 ENCOUNTER — Ambulatory Visit: Payer: Self-pay | Admitting: Urology

## 2018-04-03 ENCOUNTER — Encounter: Payer: Self-pay | Admitting: Emergency Medicine

## 2018-04-03 VITALS — BP 111/77 | HR 76 | Temp 98.1°F | Ht 65.75 in | Wt 150.8 lb

## 2018-04-03 DIAGNOSIS — E0865 Diabetes mellitus due to underlying condition with hyperglycemia: Secondary | ICD-10-CM

## 2018-04-03 DIAGNOSIS — R0789 Other chest pain: Secondary | ICD-10-CM | POA: Insufficient documentation

## 2018-04-03 DIAGNOSIS — E119 Type 2 diabetes mellitus without complications: Secondary | ICD-10-CM | POA: Insufficient documentation

## 2018-04-03 DIAGNOSIS — Z79899 Other long term (current) drug therapy: Secondary | ICD-10-CM | POA: Insufficient documentation

## 2018-04-03 DIAGNOSIS — Z7984 Long term (current) use of oral hypoglycemic drugs: Secondary | ICD-10-CM | POA: Insufficient documentation

## 2018-04-03 LAB — CBC
HEMATOCRIT: 46.6 % (ref 39.0–52.0)
Hemoglobin: 16.6 g/dL (ref 13.0–17.0)
MCH: 32.3 pg (ref 26.0–34.0)
MCHC: 35.6 g/dL (ref 30.0–36.0)
MCV: 90.7 fL (ref 80.0–100.0)
Platelets: 272 10*3/uL (ref 150–400)
RBC: 5.14 MIL/uL (ref 4.22–5.81)
RDW: 11.9 % (ref 11.5–15.5)
WBC: 8 10*3/uL (ref 4.0–10.5)
nRBC: 0 % (ref 0.0–0.2)

## 2018-04-03 LAB — TROPONIN I: Troponin I: <0.03 ng/mL (ref <0.03)

## 2018-04-03 LAB — BASIC METABOLIC PANEL
Anion gap: 8 (ref 5–15)
BUN: 18 mg/dL (ref 6–20)
CO2: 23 mmol/L (ref 22–32)
Calcium: 9.1 mg/dL (ref 8.9–10.3)
Chloride: 104 mmol/L (ref 98–111)
Creatinine, Ser: 0.77 mg/dL (ref 0.61–1.24)
GFR calc Af Amer: >60 mL/min (ref >60)
GFR calc non Af Amer: >60 mL/min (ref >60)
Glucose, Bld: 237 mg/dL — ABNORMAL HIGH (ref 70–99)
Potassium: 3.8 mmol/L (ref 3.5–5.1)
Sodium: 135 mmol/L (ref 135–145)

## 2018-04-03 LAB — GLUCOSE, CAPILLARY: GLUCOSE-CAPILLARY: 212 mg/dL — AB (ref 70–99)

## 2018-04-03 LAB — GLUCOSE, POCT (MANUAL RESULT ENTRY): POC Glucose: 254 mg/dl — AB (ref 70–99)

## 2018-04-03 MED ORDER — KETOROLAC TROMETHAMINE 30 MG/ML IJ SOLN
30.0000 mg | Freq: Once | INTRAMUSCULAR | Status: AC
  Administered 2018-04-03: 30 mg via INTRAMUSCULAR
  Filled 2018-04-03: qty 1

## 2018-04-03 MED ORDER — SODIUM CHLORIDE 0.9% FLUSH: 3.0000 mL | Freq: Once | INTRAVENOUS | Status: DC

## 2018-04-03 MED ORDER — NAPROXEN 500 MG PO TABS: 500.0000 mg | ORAL_TABLET | Freq: Two times a day (BID) | ORAL | 2 refills | Status: DC

## 2018-04-03 MED ORDER — INSULIN NPH ISOPHANE & REGULAR (70-30) 100 UNIT/ML ~~LOC~~ SUSP: SUBCUTANEOUS | 11 refills | Status: DC

## 2018-04-03 MED ORDER — METFORMIN HCL 500 MG PO TABS: 500.0000 mg | ORAL_TABLET | Freq: Two times a day (BID) | ORAL | 3 refills | Status: DC

## 2018-04-03 NOTE — Progress Notes (Signed)
  Patient: Patrick Wright Male    DOB: September 11, 1980   38 y.o.   MRN: [id number] Visit Date: 04/03/2018  Today's Provider: [place]   Chief Complaint  Patient presents with  . Follow-up    Almost out of Lisinopril   Subjective:    HPI He has been stretching his insulin over the last several weeks - he has been only injecting when his sugars have been 300-400  Does not like injecting as he states his vision gets blurry   He has been taking his metformin intermittently   He check his BS BID - 200 -400       No Known Allergies Previous Medications   ATORVASTATIN (LIPITOR) 40 MG TABLET    Take 1 tablet (40 mg total) by mouth daily at 6 PM.   BLOOD GLUCOSE METER KIT AND SUPPLIES KIT    Dispense based on patient and insurance preference. Use up to four times daily as directed. (FOR ICD-9 250.00, 250.01).   INSULIN NPH-REGULAR HUMAN (NOVOLIN 70/30) (70-30) 100 UNIT/ML INJECTION    25 units with breakfast and 20 units with evening meal.   METFORMIN (GLUCOPHAGE) 500 MG TABLET    Take 1 tablet (500 mg total) by mouth 2 (two) times daily with a meal.   NAPROXEN (NAPROSYN) 500 MG TABLET    Take 1 tablet (500 mg total) by mouth 2 (two) times daily with a meal.   TRIAMCINOLONE CREAM (KENALOG) 0.1 %    Apply 1 application topically 2 (two) times daily.    Review of Systems  Social History   Tobacco Use  . Smoking status: Current Every Day Smoker    Packs/day: 0.25    Types: Cigarettes    Start date: 55    Last attempt to quit: 2018    Years since quitting: 2.1  . Smokeless tobacco: Never Used  Substance Use Topics  . Alcohol use: Yes    Alcohol/week: 12.0 standard drinks    Types: 12 Cans of beer per week    Comment: has not drank since hospitalization   Objective:   BP 111/77 (BP Location: Left Arm, Patient Position: Sitting, Cuff Size: Normal)   Pulse 76   Temp 98.1 F (36.7 C) (Oral)   Ht 5' 5.75" (1.67 m)   Wt 150 lb 12.8 oz (68.4 kg)   BMI 24.53  kg/m   Physical Exam Constitutional:  Well nourished. Alert and oriented, No acute distress. HEENT:  AT, moist mucus membranes.  Trachea midline, no masses. Cardiovascular: No clubbing, cyanosis, or edema. Respiratory: Normal respiratory effort, no increased work of breathing. Neurologic: Grossly intact, no focal deficits, moving all 4 extremities. Psychiatric: Normal mood and affect.      Assessment & Plan:  1. DM -uncontrolled DM - limited understanding concerning disease state - he is reluctant to take the insulin as it makes his vision blurry and he does not want to be on ladders when this happens  - [facility]  -check BS TID   2. MSK chest pain -feeling better         [facility] [place]

## 2018-04-03 NOTE — ED Provider Notes (Signed)
[facility] Emergency Department Provider Note   ____________________________________________    I have reviewed the triage vital signs and the nursing notes.   HISTORY  Chief Complaint Chest Pain  Spanish interpreter used   HPI Patrick Wright is a 38 y.o. male who presents with complaints of left lower chest pain which he reports is been ongoing for 3 weeks and has been constant.  Patient reports the pain is sharp and worse with palpation.  No injury to the area.  No shortness of breath, no hemoptysis.  No calf pain or swelling.  No fevers chills or cough.  Does have a history of diabetes.  Has not take anything for this.  No radiation of pain  Past Medical History:  Diagnosis Date  . Diabetes mellitus without complication ([place])   . Hyperlipidemia   . Psoriasis     Patient Active Problem List   Diagnosis Date Noted  . Diabetes mellitus due to underlying condition, uncontrolled, with hyperglycemia ([place]) 06/12/2017  . DKA (diabetic ketoacidoses) ([place]) 05/23/2017  . Diabetic keto-acidosis ([place]) 05/23/2017    History reviewed. No pertinent surgical history.  Prior to Admission medications   Medication Sig Start Date End Date Taking? Authorizing Provider  atorvastatin (LIPITOR) 40 MG tablet Take 1 tablet (40 mg total) by mouth daily at 6 PM. 08/15/17   Azzie Glatter, FNP  blood glucose meter kit and supplies KIT Dispense based on patient and insurance preference. Use up to four times daily as directed. (FOR ICD-9 250.00, 250.01). 05/25/17   Vaughan Basta, MD  insulin NPH-regular Human (NOVOLIN 70/30) (70-30) 100 UNIT/ML injection 25 units with breakfast and 20 units with evening meal. Patient not taking: Reported on 08/15/2017 07/13/17   Doles-Johnson, Teah, NP  metFORMIN (GLUCOPHAGE) 500 MG tablet Take 1 tablet (500 mg total) by mouth 2 (two) times daily with a meal. 06/13/17   Doles-Johnson, Teah, NP  naproxen (NAPROSYN) 500 MG tablet  Take 1 tablet (500 mg total) by mouth 2 (two) times daily with a meal. 04/03/18   Lavonia Drafts, MD  triamcinolone cream (KENALOG) 0.1 % Apply 1 application topically 2 (two) times daily. Patient not taking: Reported on 06/13/2017 05/30/17   Arnetha Courser, MD     Allergies Patient has no known allergies.  Family History  Problem Relation Age of Onset  . Diabetes Mother   . Diabetes Brother   . Diabetes Daughter     Social History Social History   Tobacco Use  . Smoking status: Former Smoker    Packs/day: 2.00    Types: Cigarettes    Start date: 1992    Last attempt to quit: 2018    Years since quitting: 2.1  . Smokeless tobacco: Never Used  Substance Use Topics  . Alcohol use: Yes    Alcohol/week: 12.0 standard drinks    Types: 12 Cans of beer per week    Comment: has not drank since hospitalization  . Drug use: Not Currently    Comment: has not taken drugs since hospitalization    Review of Systems  Constitutional: No fever/chills Eyes: No visual changes.  ENT: No neck pain Cardiovascular: As above Respiratory: Denies shortness of breath. Gastrointestinal: No abdominal pain.  No nausea, no vomiting.   Genitourinary: Negative for dysuria. Musculoskeletal: Negative for back pain. Skin: Negative for rash. Neurological: Negative for headaches   ____________________________________________   PHYSICAL EXAM:  VITAL SIGNS: ED Triage Vitals [04/03/18 1358]  Enc Vitals Group  BP 125/74     Pulse Rate 74     Resp 16     Temp (!) 97.5 F (36.4 C)     Temp Source Oral     SpO2 100 %     Weight      Height      Head Circumference      Peak Flow      Pain Score 7     Pain Loc      Pain Edu?      Excl. in [place]?     Constitutional: Alert and oriented. No acute distress.  Eyes: Conjunctivae are normal.   Nose: No congestion/rhinnorhea. Mouth/Throat: Mucous membranes are moist.    Cardiovascular: Normal rate, regular rhythm. Grossly normal heart sounds.   Good peripheral circulation.  Tenderness to palpation left lateral lower chest wall, no erythema bruising or bony abnormalities Respiratory: Normal respiratory effort.  No retractions. Lungs CTAB. Gastrointestinal: Soft and nontender. No distention.  No CVA tenderness.  Musculoskeletal: No lower extremity tenderness nor edema.  Warm and well perfused Neurologic:  Normal speech and language. No gross focal neurologic deficits are appreciated.  Skin:  Skin is warm, dry and intact. No rash noted. Psychiatric: Mood and affect are normal. Speech and behavior are normal.  ____________________________________________   LABS (all labs ordered are listed, but only abnormal results are displayed)  Labs Reviewed  BASIC METABOLIC PANEL - Abnormal; Notable for the following components:      Result Value   Glucose, Bld 237 (*)    All other components within normal limits  GLUCOSE, CAPILLARY - Abnormal; Notable for the following components:   Glucose-Capillary 212 (*)    All other components within normal limits  CBC  TROPONIN I   ____________________________________________  EKG  ED ECG REPORT I, Lavonia Drafts, the attending physician, personally viewed and interpreted this ECG.  Date: 04/03/2018  Rhythm: normal sinus rhythm QRS Axis: normal Intervals: normal ST/T Wave abnormalities: normal Narrative Interpretation: no evidence of acute ischemia  ____________________________________________  RADIOLOGY  Chest x-ray unremarkable ____________________________________________   PROCEDURES  Procedure(s) performed: No  Procedures   Critical Care performed: No ____________________________________________   INITIAL IMPRESSION / ASSESSMENT AND PLAN / ED COURSE  Pertinent labs & imaging results that were available during my care of the patient were reviewed by me and considered in my medical decision making (see chart for details).  Patient presents with chest wall pain, exam  and HPI not consistent with ACS pericarditis myocarditis.  Lab work reassuring, EKG normal.  Chest x-ray benign.  Treat with IM Toradol, and NSAIDs    ____________________________________________   FINAL CLINICAL IMPRESSION(S) / ED DIAGNOSES  Final diagnoses:  Chest wall pain        Note:  This document was prepared using Dragon voice recognition software and may include unintentional dictation errors.   Lavonia Drafts, MD 04/03/18 1616

## 2018-04-03 NOTE — ED Triage Notes (Addendum)
Pt c/o CP x2wks , also states glucose has been around 400.  VSS

## 2018-04-03 NOTE — ED Notes (Signed)
Spanish interpreter requested.

## 2018-04-04 LAB — LIPID PANEL
Chol/HDL Ratio: 6 ratio — ABNORMAL HIGH (ref 0.0–5.0)
Cholesterol, Total: 185 mg/dL (ref 100–199)
HDL: 31 mg/dL — ABNORMAL LOW (ref >39)
LDL Calculated: 93 mg/dL (ref 0–99)
Triglycerides: 303 mg/dL — ABNORMAL HIGH (ref 0–149)
VLDL Cholesterol Cal: 61 mg/dL — ABNORMAL HIGH (ref 5–40)

## 2018-04-04 LAB — HEMOGLOBIN A1C
Est. average glucose Bld gHb Est-mCnc: 275 mg/dL
Hgb A1c MFr Bld: 11.2 % — ABNORMAL HIGH (ref 4.8–5.6)

## 2018-04-04 LAB — TSH: TSH: 1.77 u[IU]/mL (ref 0.450–4.500)

## 2018-04-17 ENCOUNTER — Ambulatory Visit: Payer: Self-pay | Admitting: Urology

## 2018-04-17 VITALS — BP 132/86 | HR 82 | Temp 98.0°F | Ht 62.5 in | Wt 150.6 lb

## 2018-04-17 DIAGNOSIS — E1169 Type 2 diabetes mellitus with other specified complication: Secondary | ICD-10-CM

## 2018-04-17 DIAGNOSIS — E0865 Diabetes mellitus due to underlying condition with hyperglycemia: Secondary | ICD-10-CM

## 2018-04-17 DIAGNOSIS — E785 Hyperlipidemia, unspecified: Secondary | ICD-10-CM

## 2018-04-17 MED ORDER — TRIAMCINOLONE ACETONIDE 0.1 % EX CREA: 1.0000 "application " | TOPICAL_CREAM | Freq: Two times a day (BID) | CUTANEOUS | 3 refills | Status: DC

## 2018-04-17 NOTE — Progress Notes (Signed)
Patient: Patrick Wright Male    DOB: 20-Sep-1980   38 y.o.   MRN: [id number] Visit Date: 04/17/2018  Today's Provider: [place]   Chief Complaint  Patient presents with  . Follow-up    checking blood results, out of medication for pain   Subjective:    HPI He has been stretching his insulin over the last several weeks - he has been only injecting when his sugars have been 300-400  Does not like injecting as he states his vision gets blurry   He has been taking his metformin intermittently   He check his BS BID - 200 -400  He did not bring his log in tonight.  He states that his BS have been 180-220.  High of 330.         No Known Allergies Previous Medications   ATORVASTATIN (LIPITOR) 40 MG TABLET    Take 1 tablet (40 mg total) by mouth daily at 6 PM.   BLOOD GLUCOSE METER KIT AND SUPPLIES KIT    Dispense based on patient and insurance preference. Use up to four times daily as directed. (FOR ICD-9 250.00, 250.01).   INSULIN NPH-REGULAR HUMAN (NOVOLIN 70/30) (70-30) 100 UNIT/ML INJECTION    25 units with breakfast and 20 units with evening meal.   METFORMIN (GLUCOPHAGE) 500 MG TABLET    Take 1 tablet (500 mg total) by mouth 2 (two) times daily with a meal.   NAPROXEN (NAPROSYN) 500 MG TABLET    Take 1 tablet (500 mg total) by mouth 2 (two) times daily with a meal.   TRIAMCINOLONE CREAM (KENALOG) 0.1 %    Apply 1 application topically 2 (two) times daily.    Review of Systems  Social History   Tobacco Use  . Smoking status: Current Every Day Smoker    Packs/day: 0.10    Types: Cigarettes    Start date: 61    Last attempt to quit: 2018    Years since quitting: 2.1  . Smokeless tobacco: Never Used  Substance Use Topics  . Alcohol use: Yes    Alcohol/week: 12.0 standard drinks    Types: 12 Cans of beer per week    Comment: has not drank since hospitalization   Objective:   BP 132/86 (BP Location: Left Arm, Patient Position: Sitting, Cuff Size:  Normal)   Pulse 82   Temp 98 F (36.7 C)   Ht 5' 2.5" (1.588 m)   Wt 150 lb 9.6 oz (68.3 kg)   BMI 27.11 kg/m   Physical Exam Constitutional:  Well nourished. Alert and oriented, No acute distress. HEENT: [place] AT, moist mucus membranes.  Trachea midline, no masses. Cardiovascular: No clubbing, cyanosis, or edema. Respiratory: Normal respiratory effort, no increased work of breathing. Neurologic: Grossly intact, no focal deficits, moving all 4 extremities. Psychiatric: Normal mood and affect. Skin: psoriatic plaques on fingers and elbows       Assessment & Plan:  1. DM -uncontrolled DM - limited understanding concerning disease state - he is reluctant to take the insulin as it makes his vision blurry and he does not want to be on ladders when this happens  - he states he has been injecting the insulin as prescribed and taking the metformin as prescribed - he states his vision has been better - [facility] - missed appointment due to work  -check BS TID  - RTC in one month to review log book  2. Psoriatic plaques - prescribed triamcinolone cream  BID  3. High cholesterol -recheck lipids in one month    [facility] [place]

## 2018-04-26 ENCOUNTER — Ambulatory Visit: Payer: Self-pay | Admitting: Ophthalmology

## 2018-05-08 ENCOUNTER — Other Ambulatory Visit: Payer: Self-pay

## 2018-05-15 ENCOUNTER — Ambulatory Visit: Payer: Self-pay

## 2018-06-05 ENCOUNTER — Ambulatory Visit: Payer: Self-pay | Admitting: Urology

## 2018-06-05 NOTE — Progress Notes (Deleted)
Patient did not answer.

## 2018-08-23 ENCOUNTER — Ambulatory Visit: Payer: Self-pay | Admitting: Adult Health Nurse Practitioner

## 2018-08-23 ENCOUNTER — Other Ambulatory Visit: Payer: Self-pay

## 2018-08-23 DIAGNOSIS — E1169 Type 2 diabetes mellitus with other specified complication: Secondary | ICD-10-CM

## 2018-08-23 DIAGNOSIS — E119 Type 2 diabetes mellitus without complications: Secondary | ICD-10-CM

## 2018-08-23 DIAGNOSIS — E785 Hyperlipidemia, unspecified: Secondary | ICD-10-CM

## 2018-08-23 MED ORDER — ATORVASTATIN CALCIUM 40 MG PO TABS: 40.0000 mg | ORAL_TABLET | Freq: Every day | ORAL | 4 refills | Status: DC

## 2018-08-23 MED ORDER — METFORMIN HCL 1000 MG PO TABS: 1000.0000 mg | ORAL_TABLET | Freq: Two times a day (BID) | ORAL | 3 refills | Status: DC

## 2018-08-23 MED ORDER — NOVOLIN 70/30 (70-30) 100 UNIT/ML ~~LOC~~ SUSP: SUBCUTANEOUS | 11 refills | Status: DC

## 2018-08-23 NOTE — Progress Notes (Signed)
  Patient: Patrick Wright Male    DOB: 02-10-1981   38 y.o.   MRN: [id number] Visit Date: 08/23/2018  Today's Provider: Staci Acosta, NP   No chief complaint on file.  Subjective:    HPI   Telephonic visit. Interpreter used.   DM: Taking 10-15 units of NPH  He states he takes the insulin based on what his sugar is.  If his CBG is >300 he takes 15-20 units if 100-200 takes 10-15 units.  When he has his oral medication he states he does not inject any insulin.  Last A1c was 11.2 four months ago.  Takes CBG BID- averages 200-400.       No Known Allergies Previous Medications   ATORVASTATIN (LIPITOR) 40 MG TABLET    Take 1 tablet (40 mg total) by mouth daily at 6 PM.   BLOOD GLUCOSE METER KIT AND SUPPLIES KIT    Dispense based on patient and insurance preference. Use up to four times daily as directed. (FOR ICD-9 250.00, 250.01).   INSULIN NPH-REGULAR HUMAN (NOVOLIN 70/30) (70-30) 100 UNIT/ML INJECTION    25 units with breakfast and 20 units with evening meal.   METFORMIN (GLUCOPHAGE) 500 MG TABLET    Take 1 tablet (500 mg total) by mouth 2 (two) times daily with a meal.   NAPROXEN (NAPROSYN) 500 MG TABLET    Take 1 tablet (500 mg total) by mouth 2 (two) times daily with a meal.   TRIAMCINOLONE CREAM (KENALOG) 0.1 %    Apply 1 application topically 2 (two) times daily.    Review of Systems  All other systems reviewed and are negative.   Social History   Tobacco Use  . Smoking status: Current Every Day Smoker    Packs/day: 0.10    Types: Cigarettes    Start date: 39    Last attempt to quit: 2018    Years since quitting: 2.5  . Smokeless tobacco: Never Used  Substance Use Topics  . Alcohol use: Yes    Alcohol/week: 12.0 standard drinks    Types: 12 Cans of beer per week    Comment: has not drank since hospitalization   Objective:   There were no vitals taken for this visit.  Physical Exam  No PE.     Assessment & Plan:         DM:  Not controlled.   Encourage diabetic diet and exercise.  Increase Metformin to 1080m BID.  Encourage appropriate and consistent use of NPH 10 units BID.   HLD:  Continue current regimen.  Encourage low cholesterol, low fat diet and exercise.   Scheduled for labs next week.       TStaci Acosta NP   [facility]

## 2018-08-27 ENCOUNTER — Emergency Department: Payer: Self-pay

## 2018-08-27 ENCOUNTER — Encounter: Payer: Self-pay | Admitting: Emergency Medicine

## 2018-08-27 ENCOUNTER — Other Ambulatory Visit: Payer: Self-pay

## 2018-08-27 ENCOUNTER — Emergency Department
Admission: EM | Admit: 2018-08-27 | Discharge: 2018-08-27 | Disposition: A | Discharge status: Home / Self Care | Payer: Self-pay | Attending: Emergency Medicine | Admitting: Emergency Medicine

## 2018-08-27 DIAGNOSIS — E119 Type 2 diabetes mellitus without complications: Secondary | ICD-10-CM | POA: Insufficient documentation

## 2018-08-27 DIAGNOSIS — F17210 Nicotine dependence, cigarettes, uncomplicated: Secondary | ICD-10-CM | POA: Insufficient documentation

## 2018-08-27 DIAGNOSIS — Z794 Long term (current) use of insulin: Secondary | ICD-10-CM | POA: Insufficient documentation

## 2018-08-27 DIAGNOSIS — Z79899 Other long term (current) drug therapy: Secondary | ICD-10-CM | POA: Insufficient documentation

## 2018-08-27 DIAGNOSIS — R0789 Other chest pain: Secondary | ICD-10-CM | POA: Insufficient documentation

## 2018-08-27 LAB — CBC
HCT: 43.8 % (ref 39.0–52.0)
Hemoglobin: 15.2 g/dL (ref 13.0–17.0)
MCH: 32.3 pg (ref 26.0–34.0)
MCHC: 34.7 g/dL (ref 30.0–36.0)
MCV: 93 fL (ref 80.0–100.0)
Platelets: 239 10*3/uL (ref 150–400)
RBC: 4.71 MIL/uL (ref 4.22–5.81)
RDW: 12.9 % (ref 11.5–15.5)
WBC: 6.8 10*3/uL (ref 4.0–10.5)
nRBC: 0 % (ref 0.0–0.2)

## 2018-08-27 LAB — HEPATIC FUNCTION PANEL
ALT: 22 U/L (ref 0–44)
AST: 19 U/L (ref 15–41)
Albumin: 4.1 g/dL (ref 3.5–5.0)
Alkaline Phosphatase: 67 U/L (ref 38–126)
Bilirubin, Direct: <0.1 mg/dL (ref 0.0–0.2)
Total Bilirubin: 0.6 mg/dL (ref 0.3–1.2)
Total Protein: 7.2 g/dL (ref 6.5–8.1)

## 2018-08-27 LAB — BASIC METABOLIC PANEL
Anion gap: 6 (ref 5–15)
BUN: 17 mg/dL (ref 6–20)
CO2: 23 mmol/L (ref 22–32)
Calcium: 8.8 mg/dL — ABNORMAL LOW (ref 8.9–10.3)
Chloride: 109 mmol/L (ref 98–111)
Creatinine, Ser: 0.52 mg/dL — ABNORMAL LOW (ref 0.61–1.24)
GFR calc Af Amer: >60 mL/min (ref >60)
GFR calc non Af Amer: >60 mL/min (ref >60)
Glucose, Bld: 204 mg/dL — ABNORMAL HIGH (ref 70–99)
Potassium: 3.8 mmol/L (ref 3.5–5.1)
Sodium: 138 mmol/L (ref 135–145)

## 2018-08-27 LAB — GLUCOSE, CAPILLARY: Glucose-Capillary: 172 mg/dL — ABNORMAL HIGH (ref 70–99)

## 2018-08-27 LAB — LIPASE, BLOOD: Lipase: 37 U/L (ref 11–51)

## 2018-08-27 LAB — TROPONIN I (HIGH SENSITIVITY): Troponin I (High Sensitivity): 5 ng/L (ref <18)

## 2018-08-27 MED ORDER — SODIUM CHLORIDE 0.9% FLUSH: 3.0000 mL | Freq: Once | INTRAVENOUS | Status: DC

## 2018-08-27 MED ORDER — NAPROXEN 500 MG PO TABS: 500.0000 mg | ORAL_TABLET | Freq: Two times a day (BID) | ORAL | 0 refills | Status: AC

## 2018-08-27 MED ORDER — KETOROLAC TROMETHAMINE 30 MG/ML IJ SOLN
30.0000 mg | Freq: Once | INTRAMUSCULAR | Status: DC
  Filled 2018-08-27: qty 1

## 2018-08-27 MED ORDER — KETOROLAC TROMETHAMINE 30 MG/ML IJ SOLN
30.0000 mg | Freq: Once | INTRAMUSCULAR | Status: AC
  Administered 2018-08-27: 30 mg via INTRAMUSCULAR

## 2018-08-27 NOTE — ED Notes (Signed)
Triage completed with Ronnald Collum, hospital interpretor.

## 2018-08-27 NOTE — ED Triage Notes (Signed)
Pt here with c/o left sided cp for 2 weeks now, worsens with some movements, "constant, sharp pain," shob only when pain increases, occasional cough, NAD.

## 2018-08-27 NOTE — ED Provider Notes (Signed)
[facility] Emergency Department Provider Note  ____________________________________________   First MD Initiated Contact with Patient 08/27/18 0900     (approximate)  I have reviewed the triage vital signs and the nursing notes.   HISTORY  Chief Complaint Chest Pain    HPI Patrick Wright is a 38 y.o. male who presents with chest pain for 2 weeks.  Patient versus pain that is in the middle of his chest that is constant, sharp, worse with palpation over it.  Patient does endorse an occasional mild cough and some shortness of breath when the pain becomes severe.  He does endorse recently having lifting some objects.  He endorses a little bit of pins and needle sensation in his left arm that started yesterday.  He supposed be on medications for his diabetes and hyperlipidemia but he ran out of them.  He recently had them filled today.  He denies daily alcohol use.       I utilized an Armed forces technical officer interpreter for the HPI and physical exam as well as any updates provided to the patient.   Past Medical History:  Diagnosis Date  . Diabetes mellitus without complication ([place])   . Hyperlipidemia   . Psoriasis     Patient Active Problem List   Diagnosis Date Noted  . Diabetes mellitus without complication ([place]) 32/91/9166  . Hyperlipidemia associated with type 2 diabetes mellitus ([place]) 08/23/2018  . Diabetes mellitus due to underlying condition, uncontrolled, with hyperglycemia ([place]) 06/12/2017  . DKA (diabetic ketoacidoses) ([place]) 05/23/2017  . Diabetic keto-acidosis ([place]) 05/23/2017    History reviewed. No pertinent surgical history.  Prior to Admission medications   Medication Sig Start Date End Date Taking? Authorizing Provider  atorvastatin (LIPITOR) 40 MG tablet Take 1 tablet (40 mg total) by mouth daily at 6 PM. 08/23/18   Doles-Johnson, Teah, NP  blood glucose meter kit and supplies KIT Dispense based on patient and insurance  preference. Use up to four times daily as directed. (FOR ICD-9 250.00, 250.01). 05/25/17   Vaughan Basta, MD  insulin NPH-regular Human (NOVOLIN 70/30) (70-30) 100 UNIT/ML injection 10 units with breakfast and 10 units with evening meal. 08/23/18   Doles-Johnson, Teah, NP  metFORMIN (GLUCOPHAGE) 1000 MG tablet Take 1 tablet (1,000 mg total) by mouth 2 (two) times daily with a meal. 08/23/18   Doles-Johnson, Teah, NP  naproxen (NAPROSYN) 500 MG tablet Take 1 tablet (500 mg total) by mouth 2 (two) times daily with a meal. 04/03/18   Lavonia Drafts, MD  triamcinolone cream (KENALOG) 0.1 % Apply 1 application topically 2 (two) times daily. 04/17/18   Zara Council A, PA-C    Allergies Patient has no known allergies.  Family History  Problem Relation Age of Onset  . Diabetes Mother   . Diabetes Brother   . Diabetes Daughter     Social History Social History   Tobacco Use  . Smoking status: Current Every Day Smoker    Packs/day: 0.10    Types: Cigarettes    Start date: 54    Last attempt to quit: 2018    Years since quitting: 2.5  . Smokeless tobacco: Never Used  Substance Use Topics  . Alcohol use: Yes    Alcohol/week: 12.0 standard drinks    Types: 12 Cans of beer per week    Comment: has not drank since hospitalization  . Drug use: Not Currently    Comment: has not taken drugs since hospitalization  Review of Systems Constitutional: No fever/chills Eyes: No visual changes. ENT: No sore throat. Cardiovascular: Positive chest pain Respiratory: Denies shortness of breath. Gastrointestinal: No abdominal pain.  No nausea, no vomiting.  No diarrhea.  No constipation. Genitourinary: Negative for dysuria. Musculoskeletal: Negative for back pain. Skin: Negative for rash. Neurological: Negative for headaches, positive tingling on the arm All other ROS negative ____________________________________________   PHYSICAL EXAM:  VITAL SIGNS: ED Triage Vitals  Enc  Vitals Group     BP 08/27/18 0840 117/72     Pulse Rate 08/27/18 0840 63     Resp 08/27/18 0840 18     Temp 08/27/18 0840 98.7 F (37.1 C)     Temp Source 08/27/18 0840 Oral     SpO2 08/27/18 0840 96 %     Weight 08/27/18 0855 180 lb (81.6 kg)     Height 08/27/18 0855 5' 4"  (1.626 m)     Head Circumference --      Peak Flow --      Pain Score 08/27/18 0855 6     Pain Loc --      Pain Edu? --      Excl. in [place]? --     Constitutional: Alert and oriented. Well appearing and in no acute distress. Eyes: Conjunctivae are normal. EOMI. Head: Atraumatic. Nose: No congestion/rhinnorhea. Mouth/Throat: Mucous membranes are moist.  Oropharynx non-erythematous. Neck: No stridor. Trachea Midline. FROM Cardiovascular: Normal rate, regular rhythm. Grossly normal heart sounds.  Good peripheral circulation.  Pain with palpation on his chest. Respiratory: Normal respiratory effort.  No retractions. Lungs CTAB. Gastrointestinal: Soft and nontender. No distention. No abdominal bruits.  Musculoskeletal: No lower extremity tenderness nor edema.  No joint effusions. Neurologic:  Normal speech and language.  Cranial nerves II to XII intact.  No pronator drift, equal strength in his arms and his legs. Skin:  Skin is warm, dry and intact. No rash noted. Psychiatric: Mood and affect are normal. Speech and behavior are normal. GU: Deferred   ____________________________________________   LABS (all labs ordered are listed, but only abnormal results are displayed)  Labs Reviewed  BASIC METABOLIC PANEL - Abnormal; Notable for the following components:      Result Value   Glucose, Bld 204 (*)    Creatinine, Ser 0.52 (*)    Calcium 8.8 (*)    All other components within normal limits  GLUCOSE, CAPILLARY - Abnormal; Notable for the following components:   Glucose-Capillary 172 (*)    All other components within normal limits  CBC  TROPONIN I (HIGH SENSITIVITY)  HEPATIC FUNCTION PANEL  LIPASE, BLOOD   CBG MONITORING, ED   ____________________________________________   ED ECG REPORT I, Vanessa Mayes, the attending physician, personally viewed and interpreted this ECG.  EKG is normal sinus, rate of 75, normal axis 1 mm ST elevation in V2 most likely early repolarization, normal intervals.  This looks similar to prior EKG.   ____________________________________________  [place], personally viewed and evaluated these images (plain radiographs) as part of my medical decision making, as well as reviewing the written report by the radiologist.  ED MD interpretation: Chest x-ray without widened mediastinum  Official radiology report(s): Dg Chest 2 View  Result Date: 08/27/2018 CLINICAL DATA:  Left chest pain for 2 weeks. Shortness of breath with pain. EXAM: CHEST - 2 VIEW COMPARISON:  PA and lateral chest 04/03/2018. FINDINGS: The lungs clear. Heart size is normal. No pneumothorax or pleural fluid. A prominent pulmonary vein  is seen in the medial aspect of the right lower chest consistent with a "scimitar sign" and there is mild volume loss in the right chest with shift of the mediastinum to the right. IMPRESSION: No acute disease. 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 Electronically Signed   By: Inge Rise M.D.   On: 08/27/2018 09:42    ____________________________________________   PROCEDURES  Procedure(s) performed (including Critical Care):  Procedures   ____________________________________________   INITIAL IMPRESSION / ASSESSMENT AND PLAN / ED COURSE       Patrick Wright was evaluated in Emergency Department on 08/27/2018 for the symptoms described in the history of present illness. He was evaluated in the context of the global COVID-19 pandemic, which necessitated consideration that the patient might be at risk for infection with the SARS-CoV-2 virus that causes COVID-19.  Institutional protocols and algorithms that pertain to the evaluation of patients at risk for COVID-19 are in a state of rapid change based on information released by regulatory bodies including the CDC and federal and state organizations. These policies and algorithms were followed during the patient's care in the ED.    Patient's chest pain is reproducible on examination therefore most likely this is atypical chest pain..  Will get cardiac markers to evaluate for ACS.  Consider dissection given the tingling sensation however patient does not have elevated blood pressures, chest x-ray is without widen mediastinum and the fact that this is been going on now for 2 weeks.  Neuro exam is also negative except for the subjective tingling so very low suspicion for stroke.  Possible patient pinched nerve with his heavy lifting.  Patient denies any fall or midline C-spine tenderness to suggest cervical fracture.   Clinical Course as of Aug 27 1003  Mon Aug 27, 2018  1004 Reviewed labs.  Chest pain is been going on for 2 weeks and troponin is 5 therefore I have very low suspicion that this is ACS related.  Chest x-ray was also reviewed and showed no evidence of widened mediastinum.  Patient's LFTs are normal therefore cholecystitis or gallbladder pathology is less likely.  Lipase is normal therefore pancreatitis less likely.  Patient does have slightly elevated glucose however her anion gap is normal therefore low suspicion for DKA.  Patient has multiple comorbidities that patient should restart taking his medications for.  Patient says that he has these at home.   [MF]    Clinical Course User Index [MF] Vanessa Herbster, MD    Heart score of 3.    10:34 AM given troponin of 5 in the setting of chest pain that has been going on for 2 weeks low suspicion for ACS.  Reevaluate patient and his pain continues to be reproducible on examination.  Vital signs continue to be stable.  Given a shot of Toradol.  Given a  naproxen prescription and instructed use of Tylenol and to follow-up with his primary care doctor outpatient  ____________________________________________   FINAL CLINICAL IMPRESSION(S) / ED DIAGNOSES   Final diagnoses:  Atypical chest pain      MEDICATIONS GIVEN DURING THIS VISIT:  Medications  ketorolac (TORADOL) 30 MG/ML injection 30 mg (30 mg Intramuscular Given 08/27/18 1057)     ED Discharge Orders         Ordered    naproxen (NAPROSYN) 500 MG tablet  2 times daily with meals     08/27/18 1036  Note:  This document was prepared using Dragon voice recognition software and may include unintentional dictation errors.   Vanessa [place], MD 08/27/18 [phone]

## 2018-08-27 NOTE — ED Notes (Signed)
Patient transported to X-ray 

## 2018-08-27 NOTE — Discharge Instructions (Addendum)
Your chest pain work-up was negative.  You should follow-up with your primary care doctor.  Restart your home medications.  Turn the ER with worsening chest pain or any other concerns.

## 2018-08-30 ENCOUNTER — Other Ambulatory Visit: Payer: Self-pay

## 2018-08-30 DIAGNOSIS — E119 Type 2 diabetes mellitus without complications: Secondary | ICD-10-CM

## 2018-08-31 LAB — COMPREHENSIVE METABOLIC PANEL
ALT: 28 IU/L (ref 0–44)
AST: 27 IU/L (ref 0–40)
Albumin/Globulin Ratio: 1.9 (ref 1.2–2.2)
Albumin: 4.9 g/dL (ref 4.0–5.0)
Alkaline Phosphatase: 90 IU/L (ref 39–117)
BUN/Creatinine Ratio: 23 — ABNORMAL HIGH (ref 9–20)
BUN: 17 mg/dL (ref 6–24)
Bilirubin Total: 0.3 mg/dL (ref 0.0–1.2)
CO2: 21 mmol/L (ref 20–29)
Calcium: 10.3 mg/dL — ABNORMAL HIGH (ref 8.7–10.2)
Chloride: 106 mmol/L (ref 96–106)
Creatinine, Ser: 0.74 mg/dL — ABNORMAL LOW (ref 0.76–1.27)
GFR calc Af Amer: 133 mL/min/{1.73_m2} (ref >59)
GFR calc non Af Amer: 115 mL/min/{1.73_m2} (ref >59)
Globulin, Total: 2.6 g/dL (ref 1.5–4.5)
Glucose: 116 mg/dL — ABNORMAL HIGH (ref 65–99)
Potassium: 3.9 mmol/L (ref 3.5–5.2)
Sodium: 142 mmol/L (ref 134–144)
Total Protein: 7.5 g/dL (ref 6.0–8.5)

## 2018-08-31 LAB — HEMOGLOBIN A1C

## 2018-10-11 ENCOUNTER — Other Ambulatory Visit: Payer: Self-pay

## 2018-10-11 DIAGNOSIS — E1169 Type 2 diabetes mellitus with other specified complication: Secondary | ICD-10-CM

## 2018-10-11 DIAGNOSIS — E785 Hyperlipidemia, unspecified: Secondary | ICD-10-CM

## 2018-11-15 ENCOUNTER — Other Ambulatory Visit: Payer: Self-pay

## 2018-11-22 ENCOUNTER — Ambulatory Visit: Payer: Self-pay

## 2019-02-14 ENCOUNTER — Telehealth: Payer: Self-pay | Admitting: Pharmacy Technician

## 2019-02-14 NOTE — Telephone Encounter (Signed)
Patient failed to provide requested 2020 financial documentation.  No additional medication assistance will be provided by [facility] without the required proof of income documentation.  Patient notified by letter.  [facility]

## 2019-03-28 ENCOUNTER — Other Ambulatory Visit: Payer: Self-pay

## 2019-03-28 ENCOUNTER — Ambulatory Visit: Payer: Self-pay | Admitting: Urology

## 2019-03-28 VITALS — BP 133/87 | HR 91 | Ht 61.0 in | Wt 141.8 lb

## 2019-03-28 DIAGNOSIS — E1169 Type 2 diabetes mellitus with other specified complication: Secondary | ICD-10-CM

## 2019-03-28 DIAGNOSIS — L409 Psoriasis, unspecified: Secondary | ICD-10-CM

## 2019-03-28 DIAGNOSIS — E119 Type 2 diabetes mellitus without complications: Secondary | ICD-10-CM

## 2019-03-28 MED ORDER — METFORMIN HCL 1000 MG PO TABS: 1000.0000 mg | ORAL_TABLET | Freq: Two times a day (BID) | ORAL | 3 refills | Status: DC

## 2019-03-28 MED ORDER — TRIAMCINOLONE ACETONIDE 0.1 % EX CREA: 1.0000 "application " | TOPICAL_CREAM | Freq: Two times a day (BID) | CUTANEOUS | 3 refills | Status: DC

## 2019-03-28 NOTE — Progress Notes (Signed)
  Patient: Patrick Wright Male    DOB: 1980-10-29   39 y.o.   MRN: [id number] Visit Date: 03/28/2019  Today's Provider: Zara Council, PA-C   Chief Complaint  Patient presents with  . diabetes follow-up    needs med refills metormin, insulin, triancinolone cream  . intermittent chest pain    last 15 minutes x few months, getting better, not lasting as long   Subjective:    HPI  DM  - states his BS have been in the 140's   - has not been injecting the insulin  - states he has been eating healthy    HLD  - continue atorvastatin 40 mg daily  Psoriasis   - refill triamcinolone cream   No Known Allergies Previous Medications   ATORVASTATIN (LIPITOR) 40 MG TABLET    Take 1 tablet (40 mg total) by mouth daily at 6 PM.   BLOOD GLUCOSE METER KIT AND SUPPLIES KIT    Dispense based on patient and insurance preference. Use up to four times daily as directed. (FOR ICD-9 250.00, 250.01).   INSULIN NPH-REGULAR HUMAN (NOVOLIN 70/30) (70-30) 100 UNIT/ML INJECTION    10 units with breakfast and 10 units with evening meal.   NAPROXEN (NAPROSYN) 500 MG TABLET    Take 1 tablet (500 mg total) by mouth 2 (two) times daily with a meal.    Review of Systems  Social History   Tobacco Use  . Smoking status: Current Every Day Smoker    Packs/day: 0.10    Types: Cigarettes    Start date: 54  . Smokeless tobacco: Never Used  Substance Use Topics  . Alcohol use: Yes    Alcohol/week: 12.0 standard drinks    Types: 12 Cans of beer per week    Comment: has not drank since hospitalization   Objective:   BP 133/87   Pulse 91   Ht 5' 1"  (1.549 m)   Wt 141 lb 12.8 oz (64.3 kg)   SpO2 97%   BMI 26.79 kg/m   Physical Exam Constitutional:  Well nourished. Alert and oriented, No acute distress. HEENT: [place] AT, mask in place.  Trachea midline, no masses. Cardiovascular: No clubbing, cyanosis, or edema. Respiratory: Normal respiratory effort, no increased work of breathing. Neurologic:  Grossly intact, no focal deficits, moving all 4 extremities. Psychiatric: Normal mood and affect.      Assessment & Plan:     1. Diabetes mellitus without complication ([place]) - Hemoglobin A1c - metformin refilled  - hold on insulin until labs back   2. Hyperlipidemia associated with type 2 diabetes mellitus ([place]) - Lipid panel  3. Psoriasis - refilled triamcinolone cream   4. Tobacco dependence - wants to quit given hot line  Zara Council, PA-C   [facility]

## 2019-03-29 LAB — LIPID PANEL
Chol/HDL Ratio: 5.1 ratio — ABNORMAL HIGH (ref 0.0–5.0)
Cholesterol, Total: 203 mg/dL — ABNORMAL HIGH (ref 100–199)
HDL: 40 mg/dL (ref >39)
LDL Chol Calc (NIH): 131 mg/dL — ABNORMAL HIGH (ref 0–99)
Triglycerides: 181 mg/dL — ABNORMAL HIGH (ref 0–149)
VLDL Cholesterol Cal: 32 mg/dL (ref 5–40)

## 2019-03-29 LAB — HEMOGLOBIN A1C
Est. average glucose Bld gHb Est-mCnc: 301 mg/dL
Hgb A1c MFr Bld: 12.1 % — ABNORMAL HIGH (ref 4.8–5.6)

## 2019-05-07 ENCOUNTER — Other Ambulatory Visit: Payer: Self-pay

## 2019-05-07 DIAGNOSIS — E119 Type 2 diabetes mellitus without complications: Secondary | ICD-10-CM

## 2019-05-07 MED ORDER — METFORMIN HCL 1000 MG PO TABS: 1000.0000 mg | ORAL_TABLET | Freq: Two times a day (BID) | ORAL | 3 refills | Status: DC

## 2019-05-07 MED ORDER — ATORVASTATIN CALCIUM 40 MG PO TABS: 40.0000 mg | ORAL_TABLET | Freq: Every day | ORAL | 2 refills | Status: DC

## 2019-05-07 MED ORDER — HUMULIN 70/30 (70-30) 100 UNIT/ML ~~LOC~~ SUSP: 10.0000 [IU] | SUBCUTANEOUS | 11 refills | Status: DC

## 2019-05-10 ENCOUNTER — Ambulatory Visit: Payer: Self-pay | Admitting: Pharmacy Technician

## 2019-05-10 DIAGNOSIS — Z79899 Other long term (current) drug therapy: Secondary | ICD-10-CM

## 2019-05-16 ENCOUNTER — Other Ambulatory Visit: Payer: Self-pay

## 2019-05-16 NOTE — Progress Notes (Signed)
Patient speaks Spanish.  Interpretation provided by [place] from [employer].  Interpreter [id number].  Patient approved to receive medication assistance at [facility], until time for re-certification in 4481, and as long as eligibility criteria continues to be met.  [facility]

## 2019-05-19 ENCOUNTER — Ambulatory Visit: Payer: Self-pay | Attending: Internal Medicine

## 2019-05-19 DIAGNOSIS — Z23 Encounter for immunization: Secondary | ICD-10-CM

## 2019-05-19 NOTE — Progress Notes (Signed)
   Covid-19 Vaccination Clinic  Name:  Mohamad Bruso    MRN: [id number] DOB: 1981-01-21  05/19/2019  Mr. Elveria Royals was observed post Covid-19 immunization for 15 minutes without incident. He was provided with Vaccine Information Sheet and instruction to access the V-Safe system.   Mr. Elveria Royals was instructed to call 911 with any severe reactions post vaccine: Marland Kitchen Difficulty breathing  . Swelling of face and throat  . A fast heartbeat  . A bad rash all over body  . Dizziness and weakness   Immunizations Administered    Name Date Dose VIS Date Route   Pfizer COVID-19 Vaccine 05/19/2019 10:46 AM 0.3 mL 02/01/2019 Intramuscular   Manufacturer: [place]   Lot: [id number]   [place]: [id number]

## 2019-06-09 ENCOUNTER — Ambulatory Visit: Payer: Self-pay

## 2019-06-16 ENCOUNTER — Ambulatory Visit: Payer: Self-pay | Attending: Internal Medicine

## 2019-06-16 DIAGNOSIS — Z23 Encounter for immunization: Secondary | ICD-10-CM

## 2019-06-16 NOTE — Progress Notes (Signed)
   Covid-19 Vaccination Clinic  Name:  Patrick Wright    MRN: [id number] DOB: May 08, 1980  06/16/2019  Mr. Patrick Wright was observed post Covid-19 immunization for 15 minutes without incident. He was provided with Vaccine Information Sheet and instruction to access the V-Safe system.   Mr. Patrick Wright was instructed to call 911 with any severe reactions post vaccine: Marland Kitchen Difficulty breathing  . Swelling of face and throat  . A fast heartbeat  . A bad rash all over body  . Dizziness and weakness   Immunizations Administered    Name Date Dose VIS Date Route   Pfizer COVID-19 Vaccine 06/16/2019 10:43 AM 0.3 mL 04/17/2018 Intramuscular   Manufacturer: [employer], [employer]   Lot: [id number]   [place]: [id number]

## 2019-09-26 ENCOUNTER — Ambulatory Visit: Payer: Self-pay

## 2020-01-14 ENCOUNTER — Emergency Department: Payer: Self-pay

## 2020-01-14 ENCOUNTER — Observation Stay
Admission: EM | Admit: 2020-01-14 | Discharge: 2020-01-15 | Disposition: A | Discharge status: Home / Self Care | Payer: Self-pay | Attending: Internal Medicine | Admitting: Internal Medicine

## 2020-01-14 ENCOUNTER — Other Ambulatory Visit: Payer: Self-pay

## 2020-01-14 DIAGNOSIS — Z79899 Other long term (current) drug therapy: Secondary | ICD-10-CM | POA: Insufficient documentation

## 2020-01-14 DIAGNOSIS — E785 Hyperlipidemia, unspecified: Secondary | ICD-10-CM | POA: Diagnosis present

## 2020-01-14 DIAGNOSIS — Z794 Long term (current) use of insulin: Secondary | ICD-10-CM | POA: Insufficient documentation

## 2020-01-14 DIAGNOSIS — F1010 Alcohol abuse, uncomplicated: Secondary | ICD-10-CM | POA: Diagnosis present

## 2020-01-14 DIAGNOSIS — Z7984 Long term (current) use of oral hypoglycemic drugs: Secondary | ICD-10-CM | POA: Insufficient documentation

## 2020-01-14 DIAGNOSIS — Z720 Tobacco use: Secondary | ICD-10-CM | POA: Diagnosis present

## 2020-01-14 DIAGNOSIS — R109 Unspecified abdominal pain: Secondary | ICD-10-CM | POA: Diagnosis present

## 2020-01-14 DIAGNOSIS — F1091 Alcohol use, unspecified, in remission: Secondary | ICD-10-CM | POA: Diagnosis present

## 2020-01-14 DIAGNOSIS — K859 Acute pancreatitis without necrosis or infection, unspecified: Secondary | ICD-10-CM | POA: Diagnosis present

## 2020-01-14 DIAGNOSIS — E1100 Type 2 diabetes mellitus with hyperosmolarity without nonketotic hyperglycemic-hyperosmolar coma (NKHHC): Principal | ICD-10-CM | POA: Diagnosis present

## 2020-01-14 DIAGNOSIS — Z20822 Contact with and (suspected) exposure to covid-19: Secondary | ICD-10-CM | POA: Insufficient documentation

## 2020-01-14 DIAGNOSIS — F17210 Nicotine dependence, cigarettes, uncomplicated: Secondary | ICD-10-CM | POA: Insufficient documentation

## 2020-01-14 DIAGNOSIS — I10 Essential (primary) hypertension: Secondary | ICD-10-CM | POA: Insufficient documentation

## 2020-01-14 DIAGNOSIS — K852 Alcohol induced acute pancreatitis without necrosis or infection: Secondary | ICD-10-CM

## 2020-01-14 DIAGNOSIS — E0865 Diabetes mellitus due to underlying condition with hyperglycemia: Secondary | ICD-10-CM

## 2020-01-14 DIAGNOSIS — R1013 Epigastric pain: Secondary | ICD-10-CM

## 2020-01-14 DIAGNOSIS — E119 Type 2 diabetes mellitus without complications: Secondary | ICD-10-CM

## 2020-01-14 DIAGNOSIS — E1165 Type 2 diabetes mellitus with hyperglycemia: Secondary | ICD-10-CM | POA: Diagnosis present

## 2020-01-14 DIAGNOSIS — R739 Hyperglycemia, unspecified: Secondary | ICD-10-CM | POA: Insufficient documentation

## 2020-01-14 HISTORY — DX: Essential (primary) hypertension: I10

## 2020-01-14 HISTORY — DX: Acute pancreatitis without necrosis or infection, unspecified: K85.90

## 2020-01-14 LAB — COMPREHENSIVE METABOLIC PANEL
ALT: 29 U/L (ref 0–44)
AST: 22 U/L (ref 15–41)
Albumin: 4.5 g/dL (ref 3.5–5.0)
Alkaline Phosphatase: 147 U/L — ABNORMAL HIGH (ref 38–126)
Anion gap: 12 (ref 5–15)
BUN: 16 mg/dL (ref 6–20)
CO2: 24 mmol/L (ref 22–32)
Calcium: 9.6 mg/dL (ref 8.9–10.3)
Chloride: 88 mmol/L — ABNORMAL LOW (ref 98–111)
Creatinine, Ser: 0.89 mg/dL (ref 0.61–1.24)
GFR, Estimated: >60 mL/min (ref >60)
Glucose, Bld: 808 mg/dL (ref 70–99)
Potassium: 4.8 mmol/L (ref 3.5–5.1)
Sodium: 124 mmol/L — ABNORMAL LOW (ref 135–145)
Total Bilirubin: 1.2 mg/dL (ref 0.3–1.2)
Total Protein: 7.8 g/dL (ref 6.5–8.1)

## 2020-01-14 LAB — BASIC METABOLIC PANEL
Anion gap: 7 (ref 5–15)
Anion gap: 7 (ref 5–15)
Anion gap: 8 (ref 5–15)
BUN: 13 mg/dL (ref 6–20)
BUN: 12 mg/dL (ref 6–20)
BUN: 12 mg/dL (ref 6–20)
CO2: 25 mmol/L (ref 22–32)
CO2: 28 mmol/L (ref 22–32)
CO2: 28 mmol/L (ref 22–32)
Calcium: 8.5 mg/dL — ABNORMAL LOW (ref 8.9–10.3)
Calcium: 8.8 mg/dL — ABNORMAL LOW (ref 8.9–10.3)
Calcium: 8.7 mg/dL — ABNORMAL LOW (ref 8.9–10.3)
Chloride: 103 mmol/L (ref 98–111)
Chloride: 103 mmol/L (ref 98–111)
Chloride: 104 mmol/L (ref 98–111)
Creatinine, Ser: 0.71 mg/dL (ref 0.61–1.24)
Creatinine, Ser: 0.72 mg/dL (ref 0.61–1.24)
Creatinine, Ser: 0.66 mg/dL (ref 0.61–1.24)
GFR, Estimated: >60 mL/min (ref >60)
GFR, Estimated: >60 mL/min (ref >60)
GFR, Estimated: >60 mL/min (ref >60)
Glucose, Bld: 325 mg/dL — ABNORMAL HIGH (ref 70–99)
Glucose, Bld: 142 mg/dL — ABNORMAL HIGH (ref 70–99)
Glucose, Bld: 172 mg/dL — ABNORMAL HIGH (ref 70–99)
Potassium: 3 mmol/L — ABNORMAL LOW (ref 3.5–5.1)
Potassium: 3.5 mmol/L (ref 3.5–5.1)
Potassium: 3.2 mmol/L — ABNORMAL LOW (ref 3.5–5.1)
Sodium: 135 mmol/L (ref 135–145)
Sodium: 138 mmol/L (ref 135–145)
Sodium: 140 mmol/L (ref 135–145)

## 2020-01-14 LAB — GLUCOSE, CAPILLARY
Glucose-Capillary: 124 mg/dL — ABNORMAL HIGH (ref 70–99)
Glucose-Capillary: 131 mg/dL — ABNORMAL HIGH (ref 70–99)
Glucose-Capillary: 134 mg/dL — ABNORMAL HIGH (ref 70–99)
Glucose-Capillary: 171 mg/dL — ABNORMAL HIGH (ref 70–99)
Glucose-Capillary: 180 mg/dL — ABNORMAL HIGH (ref 70–99)
Glucose-Capillary: 190 mg/dL — ABNORMAL HIGH (ref 70–99)
Glucose-Capillary: 210 mg/dL — ABNORMAL HIGH (ref 70–99)
Glucose-Capillary: 348 mg/dL — ABNORMAL HIGH (ref 70–99)

## 2020-01-14 LAB — CBC
HCT: 43.1 % (ref 39.0–52.0)
Hemoglobin: 15.6 g/dL (ref 13.0–17.0)
MCH: 32.6 pg (ref 26.0–34.0)
MCHC: 36.2 g/dL — ABNORMAL HIGH (ref 30.0–36.0)
MCV: 90 fL (ref 80.0–100.0)
Platelets: 266 10*3/uL (ref 150–400)
RBC: 4.79 MIL/uL (ref 4.22–5.81)
RDW: 11.7 % (ref 11.5–15.5)
WBC: 5.9 10*3/uL (ref 4.0–10.5)
nRBC: 0 % (ref 0.0–0.2)

## 2020-01-14 LAB — URINALYSIS, COMPLETE (UACMP) WITH MICROSCOPIC
Bacteria, UA: NONE SEEN
Bilirubin Urine: NEGATIVE
Glucose, UA: >=500 mg/dL — AB
Hgb urine dipstick: NEGATIVE
Ketones, ur: 5 mg/dL — AB
Leukocytes,Ua: NEGATIVE
Nitrite: NEGATIVE
Protein, ur: NEGATIVE mg/dL
Specific Gravity, Urine: 1.028 (ref 1.005–1.030)
pH: 7 (ref 5.0–8.0)

## 2020-01-14 LAB — TROPONIN I (HIGH SENSITIVITY)
Troponin I (High Sensitivity): 3 ng/L (ref <18)
Troponin I (High Sensitivity): 2 ng/L (ref <18)

## 2020-01-14 LAB — RESP PANEL BY RT-PCR (FLU A&B, COVID) ARPGX2
Influenza A by PCR: NEGATIVE
Influenza B by PCR: NEGATIVE
SARS Coronavirus 2 by RT PCR: NEGATIVE

## 2020-01-14 LAB — HIV ANTIBODY (ROUTINE TESTING W REFLEX): HIV Screen 4th Generation wRfx: NONREACTIVE

## 2020-01-14 LAB — CBG MONITORING, ED
Glucose-Capillary: 296 mg/dL — ABNORMAL HIGH (ref 70–99)
Glucose-Capillary: 367 mg/dL — ABNORMAL HIGH (ref 70–99)

## 2020-01-14 LAB — OSMOLALITY: Osmolality: 304 mOsm/kg — ABNORMAL HIGH (ref 275–295)

## 2020-01-14 LAB — MRSA PCR SCREENING: MRSA by PCR: NEGATIVE

## 2020-01-14 LAB — TRIGLYCERIDES: Triglycerides: 354 mg/dL — ABNORMAL HIGH (ref <150)

## 2020-01-14 LAB — LIPASE, BLOOD: Lipase: 134 U/L — ABNORMAL HIGH (ref 11–51)

## 2020-01-14 MED ORDER — DEXTROSE 50 % IV SOLN: 0.0000 mL | INTRAVENOUS | Status: DC | PRN

## 2020-01-14 MED ORDER — LACTATED RINGERS IV SOLN: INTRAVENOUS | Status: DC

## 2020-01-14 MED ORDER — INSULIN REGULAR(HUMAN) IN NACL 100-0.9 UT/100ML-% IV SOLN: INTRAVENOUS | Status: DC

## 2020-01-14 MED ORDER — ADULT MULTIVITAMIN W/MINERALS CH
1.0000 | ORAL_TABLET | Freq: Every day | ORAL | Status: DC
  Administered 2020-01-14 – 2020-01-15 (×2): 1 via ORAL
  Filled 2020-01-14 (×2): qty 1

## 2020-01-14 MED ORDER — SODIUM CHLORIDE 0.9 % IV BOLUS
1000.0000 mL | Freq: Once | INTRAVENOUS | Status: AC
  Administered 2020-01-14: 1000 mL via INTRAVENOUS

## 2020-01-14 MED ORDER — MORPHINE SULFATE (PF) 2 MG/ML IV SOLN: 2.0000 mg | INTRAVENOUS | Status: DC | PRN

## 2020-01-14 MED ORDER — HYDRALAZINE HCL 20 MG/ML IJ SOLN: 5.0000 mg | INTRAMUSCULAR | Status: DC | PRN

## 2020-01-14 MED ORDER — ENOXAPARIN SODIUM 40 MG/0.4ML ~~LOC~~ SOLN
40.0000 mg | SUBCUTANEOUS | Status: DC
  Administered 2020-01-14: 40 mg via SUBCUTANEOUS
  Filled 2020-01-14: qty 0.4

## 2020-01-14 MED ORDER — THIAMINE HCL 100 MG PO TABS
100.0000 mg | ORAL_TABLET | Freq: Every day | ORAL | Status: DC
  Administered 2020-01-14 – 2020-01-15 (×2): 100 mg via ORAL
  Filled 2020-01-14 (×2): qty 1

## 2020-01-14 MED ORDER — POTASSIUM CHLORIDE 10 MEQ/100ML IV SOLN
10.0000 meq | INTRAVENOUS | Status: AC
  Administered 2020-01-14: 10 meq via INTRAVENOUS
  Filled 2020-01-14: qty 100

## 2020-01-14 MED ORDER — DEXTROSE IN LACTATED RINGERS 5 % IV SOLN: INTRAVENOUS | Status: DC

## 2020-01-14 MED ORDER — LORAZEPAM 2 MG/ML IJ SOLN: 1.0000 mg | INTRAMUSCULAR | Status: DC | PRN

## 2020-01-14 MED ORDER — INSULIN REGULAR(HUMAN) IN NACL 100-0.9 UT/100ML-% IV SOLN
INTRAVENOUS | Status: DC
  Administered 2020-01-14: 14 [IU]/h via INTRAVENOUS
  Filled 2020-01-14: qty 100

## 2020-01-14 MED ORDER — LORAZEPAM 2 MG/ML IJ SOLN: 0.0000 mg | Freq: Two times a day (BID) | INTRAMUSCULAR | Status: DC

## 2020-01-14 MED ORDER — THIAMINE HCL 100 MG/ML IJ SOLN: 100.0000 mg | Freq: Every day | INTRAMUSCULAR | Status: DC

## 2020-01-14 MED ORDER — ONDANSETRON HCL 4 MG/2ML IJ SOLN: 4.0000 mg | Freq: Three times a day (TID) | INTRAMUSCULAR | Status: DC | PRN

## 2020-01-14 MED ORDER — IOHEXOL 300 MG/ML  SOLN
100.0000 mL | Freq: Once | INTRAMUSCULAR | Status: AC | PRN
  Administered 2020-01-14: 100 mL via INTRAVENOUS

## 2020-01-14 MED ORDER — LORAZEPAM 2 MG/ML IJ SOLN: 0.0000 mg | Freq: Four times a day (QID) | INTRAMUSCULAR | Status: DC

## 2020-01-14 MED ORDER — FOLIC ACID 1 MG PO TABS
1.0000 mg | ORAL_TABLET | Freq: Every day | ORAL | Status: DC
  Administered 2020-01-14 – 2020-01-15 (×2): 1 mg via ORAL
  Filled 2020-01-14 (×2): qty 1

## 2020-01-14 MED ORDER — LORAZEPAM 1 MG PO TABS: 1.0000 mg | ORAL_TABLET | ORAL | Status: DC | PRN

## 2020-01-14 MED ORDER — ATORVASTATIN CALCIUM 20 MG PO TABS: 40.0000 mg | ORAL_TABLET | Freq: Every day | ORAL | Status: DC

## 2020-01-14 MED ORDER — NICOTINE 21 MG/24HR TD PT24
21.0000 mg | MEDICATED_PATCH | Freq: Every day | TRANSDERMAL | Status: DC
  Administered 2020-01-14 – 2020-01-15 (×2): 21 mg via TRANSDERMAL
  Filled 2020-01-14 (×2): qty 1

## 2020-01-14 MED ORDER — PANTOPRAZOLE SODIUM 40 MG PO TBEC
40.0000 mg | DELAYED_RELEASE_TABLET | Freq: Every day | ORAL | Status: DC
  Administered 2020-01-15: 40 mg via ORAL
  Filled 2020-01-14: qty 1

## 2020-01-14 MED ORDER — ACETAMINOPHEN 325 MG PO TABS
650.0000 mg | ORAL_TABLET | Freq: Four times a day (QID) | ORAL | Status: DC | PRN
  Administered 2020-01-14: 650 mg via ORAL
  Filled 2020-01-14: qty 2

## 2020-01-14 NOTE — ED Provider Notes (Signed)
[facility] Emergency Department Provider Note    First MD Initiated Contact with Patient 01/14/20 1306     (approximate)  I have reviewed the triage vital signs and the nursing notes.   HISTORY  Chief Complaint Abdominal Pain    HPI Patrick Wright is a 39 y.o. male history of diabetes as well as alcoholic pancreatitis presents to the ER for the more than 2 weeks of nausea vomiting epigastric pain becoming increasingly worse for is having trouble keeping any food down. He does take Metformin was previously on insulin not currently taking any injectable antihyperglycemic medications. Denies any fevers. No chest pain. Currently rates his pain as moderate to severe. Pain does not moved through to his back.    Past Medical History:  Diagnosis Date  . Diabetes mellitus without complication ([place])   . Hyperlipidemia   . Hypertension   . Pancreatitis   . Psoriasis    Family History  Problem Relation Age of Onset  . Diabetes Mother   . Diabetes Brother   . Diabetes Daughter    History reviewed. No pertinent surgical history. Patient Active Problem List   Diagnosis Date Noted  . Diabetes mellitus without complication ([place]) 32/01/2481  . Hyperlipidemia associated with type 2 diabetes mellitus ([place]) 08/23/2018  . Diabetes mellitus due to underlying condition, uncontrolled, with hyperglycemia ([place]) 06/12/2017  . DKA (diabetic ketoacidoses) 05/23/2017  . Diabetic keto-acidosis ([place]) 05/23/2017      Prior to Admission medications   Medication Sig Start Date End Date Taking? Authorizing Provider  atorvastatin (LIPITOR) 40 MG tablet Take 1 tablet (40 mg total) by mouth daily at 6 PM. 05/07/19   Iloabachie, Chioma E, NP  blood glucose meter kit and supplies KIT Dispense based on patient and insurance preference. Use up to four times daily as directed. (FOR ICD-9 250.00, 250.01). 05/25/17   Vaughan Basta, MD  insulin NPH-regular Human (HUMULIN  70/30) (70-30) 100 UNIT/ML injection Inject 10 Units into the skin as directed. Take 10 units with breakfast and 10 units with evening meal. 05/07/19 06/06/19  Iloabachie, Chioma E, NP  metFORMIN (GLUCOPHAGE) 1000 MG tablet Take 1 tablet (1,000 mg total) by mouth 2 (two) times daily with a meal. 05/07/19   Iloabachie, Chioma E, NP  triamcinolone cream (KENALOG) 0.1 % Apply 1 application topically 2 (two) times daily. 03/28/19   Zara Council A, PA-C    Allergies Patient has no known allergies.    Social History Social History   Tobacco Use  . Smoking status: Current Every Day Smoker    Packs/day: 0.10    Types: Cigarettes    Start date: 35  . Smokeless tobacco: Never Used  Substance Use Topics  . Alcohol use: Yes    Alcohol/week: 12.0 standard drinks    Types: 12 Cans of beer per week    Comment: has not drank since hospitalization  . Drug use: Not Currently    Comment: has not taken drugs since hospitalization    Review of Systems Patient denies headaches, rhinorrhea, blurry vision, numbness, shortness of breath, chest pain, edema, cough, abdominal pain, nausea, vomiting, diarrhea, dysuria, fevers, rashes or hallucinations unless otherwise stated above in HPI. ____________________________________________   PHYSICAL EXAM:  VITAL SIGNS: Vitals:   01/14/20 1330 01/14/20 1400  BP: 104/79 109/77  Pulse: 61 71  Resp: 16 18  Temp:    SpO2: 98% 99%    Constitutional: Alert and oriented.  Eyes: Conjunctivae are normal.  Head: Atraumatic. Nose: No  congestion/rhinnorhea. Mouth/Throat: Mucous membranes are moist.   Neck: No stridor. Painless ROM.  Cardiovascular: Normal rate, regular rhythm. Grossly normal heart sounds.  Good peripheral circulation. Respiratory: Normal respiratory effort.  No retractions. Lungs CTAB. Gastrointestinal: Soft and nontender. No distention. No abdominal bruits. No CVA tenderness. Genitourinary:  Musculoskeletal: No lower extremity tenderness  nor edema.  No joint effusions. Neurologic:  Normal speech and language. No gross focal neurologic deficits are appreciated. No facial droop Skin:  Skin is warm, dry and intact. No rash noted. Psychiatric: Mood and affect are normal. Speech and behavior are normal.  ____________________________________________   LABS (all labs ordered are listed, but only abnormal results are displayed)  Results for orders placed or performed during the hospital encounter of 01/14/20 (from the past 24 hour(s))  Troponin I (High Sensitivity)     Status: None   Collection Time: 01/14/20 11:40 AM  Result Value Ref Range   Troponin I (High Sensitivity) 3 <18 ng/L  Lipase, blood     Status: Abnormal   Collection Time: 01/14/20 11:40 AM  Result Value Ref Range   Lipase 134 (H) 11 - 51 U/L  Comprehensive metabolic panel     Status: Abnormal   Collection Time: 01/14/20 11:40 AM  Result Value Ref Range   Sodium 124 (L) 135 - 145 mmol/L   Potassium 4.8 3.5 - 5.1 mmol/L   Chloride 88 (L) 98 - 111 mmol/L   CO2 24 22 - 32 mmol/L   Glucose, Bld 808 (HH) 70 - 99 mg/dL   BUN 16 6 - 20 mg/dL   Creatinine, Ser 0.89 0.61 - 1.24 mg/dL   Calcium 9.6 8.9 - 10.3 mg/dL   Total Protein 7.8 6.5 - 8.1 g/dL   Albumin 4.5 3.5 - 5.0 g/dL   AST 22 15 - 41 U/L   ALT 29 0 - 44 U/L   Alkaline Phosphatase 147 (H) 38 - 126 U/L   Total Bilirubin 1.2 0.3 - 1.2 mg/dL   GFR, Estimated >60 >60 mL/min   Anion gap 12 5 - 15  CBC     Status: Abnormal   Collection Time: 01/14/20 11:40 AM  Result Value Ref Range   WBC 5.9 4.0 - 10.5 K/uL   RBC 4.79 4.22 - 5.81 MIL/uL   Hemoglobin 15.6 13.0 - 17.0 g/dL   HCT 43.1 39 - 52 %   MCV 90.0 80.0 - 100.0 fL   MCH 32.6 26.0 - 34.0 pg   MCHC 36.2 (H) 30.0 - 36.0 g/dL   RDW 11.7 11.5 - 15.5 %   Platelets 266 150 - 400 K/uL   nRBC 0.0 0.0 - 0.2 %  Urinalysis, Complete w Microscopic     Status: Abnormal   Collection Time: 01/14/20 11:40 AM  Result Value Ref Range   Color, Urine COLORLESS  (A) YELLOW   APPearance CLEAR (A) CLEAR   Specific Gravity, Urine 1.028 1.005 - 1.030   pH 7.0 5.0 - 8.0   Glucose, UA >=500 (A) NEGATIVE mg/dL   Hgb urine dipstick NEGATIVE NEGATIVE   Bilirubin Urine NEGATIVE NEGATIVE   Ketones, ur 5 (A) NEGATIVE mg/dL   Protein, ur NEGATIVE NEGATIVE mg/dL   Nitrite NEGATIVE NEGATIVE   Leukocytes,Ua NEGATIVE NEGATIVE   RBC / HPF 0-5 0 - 5 RBC/hpf   WBC, UA 0-5 0 - 5 WBC/hpf   Bacteria, UA NONE SEEN NONE SEEN   Squamous Epithelial / LPF 0-5 0 - 5  Resp Panel by RT-PCR (Flu A&B, Covid) Nasopharyngeal  Swab     Status: None   Collection Time: 01/14/20  1:16 PM   Specimen: Nasopharyngeal Swab; Nasopharyngeal(NP) swabs in vial transport medium  Result Value Ref Range   SARS Coronavirus 2 by RT PCR NEGATIVE NEGATIVE   Influenza A by PCR NEGATIVE NEGATIVE   Influenza B by PCR NEGATIVE NEGATIVE   ____________________________________________  EKG My review and personal interpretation at Time: 11:31   Indication: abd pain  Rate: 75  Rhythm: sinus Axis: normal Other: no stemi, rbbb, nonspecific st abn ____________________________________________  RADIOLOGY  I personally reviewed all radiographic images ordered to evaluate for the above acute complaints and reviewed radiology reports and findings.  These findings were personally discussed with the patient.  Please see medical record for radiology report.  ____________________________________________   PROCEDURES  Procedure(s) performed:  .Critical Care Performed by: Merlyn Lot, MD Authorized by: Merlyn Lot, MD   Critical care provider statement:    Critical care time (minutes):  15   Critical care time was exclusive of:  Separately billable procedures and treating other patients   Critical care was necessary to treat or prevent imminent or life-threatening deterioration of the following conditions:  Endocrine crisis   Critical care was time spent personally by me on the following  activities:  Development of treatment plan with patient or surrogate, discussions with consultants, evaluation of patient's response to treatment, examination of patient, obtaining history from patient or surrogate, ordering and performing treatments and interventions, ordering and review of laboratory studies, ordering and review of radiographic studies, pulse oximetry, re-evaluation of patient's condition and review of old charts      Critical Care performed: yes ____________________________________________   INITIAL IMPRESSION / [place] / ED COURSE  Pertinent labs & imaging results that were available during my care of the patient were reviewed by me and considered in my medical decision making (see chart for details).   DDX: DKA, HHS, dehydration, electrolyte abnormality, medication noncompliance, pancreatitis, alcoholic gastritis, enteritis  Patrick Wright is a 39 y.o. who presents to the ED with presentation as described above.  Patient is ill-appearing with significant hyperglycemia.  No noted q. small respirations.  Blood work does not show any evidence of anion gap elevation with normal bicarb.  Concern for pure hyperglycemia possible HHS.    Clinical Course as of Jan 14 1431  Tue Jan 14, 2020  1418 CT imaging does not show any evidence of significant pancreatic inflammatory process or pseudocyst.  Given the patient's significant hyperglycemia will discuss with hospitalist.  I have ordered IV fluids as well as insulin infusion.   [PR]    Clinical Course User Index [PR] Merlyn Lot, MD    The patient was evaluated in Emergency Department today for the symptoms described in the history of present illness. He/she was evaluated in the context of the global COVID-19 pandemic, which necessitated consideration that the patient might be at risk for infection with the SARS-CoV-2 virus that causes COVID-19. Institutional protocols and algorithms that pertain to the  evaluation of patients at risk for COVID-19 are in a state of rapid change based on information released by regulatory bodies including the CDC and federal and state organizations. These policies and algorithms were followed during the patient's care in the ED.  As part of my medical decision making, I reviewed the following data within the [place] notes reviewed and incorporated, Labs reviewed, notes from prior ED visits and [place] Controlled Substance Database   ____________________________________________  FINAL CLINICAL IMPRESSION(S) / ED DIAGNOSES  Final diagnoses:  Hyperglycemia      NEW MEDICATIONS STARTED DURING THIS VISIT:  New Prescriptions   No medications on file     Note:  This document was prepared using Dragon voice recognition software and may include unintentional dictation errors.    Merlyn Lot, MD 01/14/20 1432

## 2020-01-14 NOTE — ED Notes (Signed)
Dr Niu at bedside 

## 2020-01-14 NOTE — Plan of Care (Signed)
Patient received from ED at 1600 for hyperglycemia requiring an insulin infusion. LR @ 125 and insulin @ 12 infusing upon arrival. Spanish interpreter at bedside. Unit orientation completed. Patient denied having any questions/concerns at this time. Recheck of BG 180, result entered into endotool and insulin infusion decreased to 1 unit/hr per recommendations at this time. LR discontinued and D5LR started per orders for BG <250. Will continue to monitor.

## 2020-01-14 NOTE — Plan of Care (Signed)
Patient just arriving to unit. Education videos not started, will need to meet with diabetes educator to discuss self care/discharge needs.
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
## 2020-01-14 NOTE — ED Notes (Signed)
Date and time results received: 01/14/20 12:15 (use smartphrase ".now" to insert current time)  Test: Serum Glucose Critical Value: 808  Name of Provider Notified: Quentin Cornwall  Orders Received? Or Actions Taken?: Acknowledged, no new orders received

## 2020-01-15 ENCOUNTER — Other Ambulatory Visit: Payer: Self-pay | Admitting: Internal Medicine

## 2020-01-15 DIAGNOSIS — I10 Essential (primary) hypertension: Secondary | ICD-10-CM

## 2020-01-15 LAB — URINE DRUG SCREEN, QUALITATIVE (ARMC ONLY)
Amphetamines, Ur Screen: NOT DETECTED
Barbiturates, Ur Screen: NOT DETECTED
Benzodiazepine, Ur Scrn: NOT DETECTED
Cannabinoid 50 Ng, Ur ~~LOC~~: POSITIVE — AB
Cocaine Metabolite,Ur ~~LOC~~: NOT DETECTED
MDMA (Ecstasy)Ur Screen: NOT DETECTED
Methadone Scn, Ur: NOT DETECTED
Opiate, Ur Screen: NOT DETECTED
Phencyclidine (PCP) Ur S: NOT DETECTED
Tricyclic, Ur Screen: NOT DETECTED

## 2020-01-15 LAB — BASIC METABOLIC PANEL
Anion gap: 7 (ref 5–15)
Anion gap: 7 (ref 5–15)
BUN: 13 mg/dL (ref 6–20)
BUN: 14 mg/dL (ref 6–20)
CO2: 27 mmol/L (ref 22–32)
CO2: 26 mmol/L (ref 22–32)
Calcium: 8.6 mg/dL — ABNORMAL LOW (ref 8.9–10.3)
Calcium: 8.7 mg/dL — ABNORMAL LOW (ref 8.9–10.3)
Chloride: 106 mmol/L (ref 98–111)
Chloride: 106 mmol/L (ref 98–111)
Creatinine, Ser: 0.64 mg/dL (ref 0.61–1.24)
Creatinine, Ser: 0.63 mg/dL (ref 0.61–1.24)
GFR, Estimated: >60 mL/min (ref >60)
GFR, Estimated: >60 mL/min (ref >60)
Glucose, Bld: 116 mg/dL — ABNORMAL HIGH (ref 70–99)
Glucose, Bld: 162 mg/dL — ABNORMAL HIGH (ref 70–99)
Potassium: 3.5 mmol/L (ref 3.5–5.1)
Potassium: 3.8 mmol/L (ref 3.5–5.1)
Sodium: 140 mmol/L (ref 135–145)
Sodium: 139 mmol/L (ref 135–145)

## 2020-01-15 LAB — CBC
HCT: 38.3 % — ABNORMAL LOW (ref 39.0–52.0)
Hemoglobin: 13.6 g/dL (ref 13.0–17.0)
MCH: 32.5 pg (ref 26.0–34.0)
MCHC: 35.5 g/dL (ref 30.0–36.0)
MCV: 91.4 fL (ref 80.0–100.0)
Platelets: 253 10*3/uL (ref 150–400)
RBC: 4.19 MIL/uL — ABNORMAL LOW (ref 4.22–5.81)
RDW: 12.2 % (ref 11.5–15.5)
WBC: 7.1 10*3/uL (ref 4.0–10.5)
nRBC: 0 % (ref 0.0–0.2)

## 2020-01-15 LAB — GLUCOSE, CAPILLARY
Glucose-Capillary: 118 mg/dL — ABNORMAL HIGH (ref 70–99)
Glucose-Capillary: 119 mg/dL — ABNORMAL HIGH (ref 70–99)
Glucose-Capillary: 123 mg/dL — ABNORMAL HIGH (ref 70–99)
Glucose-Capillary: 133 mg/dL — ABNORMAL HIGH (ref 70–99)
Glucose-Capillary: 135 mg/dL — ABNORMAL HIGH (ref 70–99)
Glucose-Capillary: 161 mg/dL — ABNORMAL HIGH (ref 70–99)
Glucose-Capillary: 163 mg/dL — ABNORMAL HIGH (ref 70–99)
Glucose-Capillary: 170 mg/dL — ABNORMAL HIGH (ref 70–99)

## 2020-01-15 MED ORDER — CHLORHEXIDINE GLUCONATE CLOTH 2 % EX PADS: 6.0000 | MEDICATED_PAD | Freq: Every day | CUTANEOUS | Status: DC

## 2020-01-15 MED ORDER — HUMULIN 70/30 (70-30) 100 UNIT/ML ~~LOC~~ SUSP: 10.0000 [IU] | SUBCUTANEOUS | 6 refills | Status: DC

## 2020-01-15 MED ORDER — INSULIN GLARGINE 100 UNIT/ML ~~LOC~~ SOLN
10.0000 [IU] | Freq: Once | SUBCUTANEOUS | Status: AC
  Administered 2020-01-15: 10 [IU] via SUBCUTANEOUS
  Filled 2020-01-15: qty 0.1

## 2020-01-15 MED ORDER — POTASSIUM CHLORIDE CRYS ER 20 MEQ PO TBCR
40.0000 meq | EXTENDED_RELEASE_TABLET | ORAL | Status: AC
  Administered 2020-01-15 (×2): 40 meq via ORAL
  Filled 2020-01-15 (×2): qty 2

## 2020-01-15 MED ORDER — LIVING WELL WITH DIABETES BOOK - IN SPANISH
Freq: Once | Status: AC
  Filled 2020-01-15: qty 1

## 2020-01-15 NOTE — Progress Notes (Signed)
[place] visited pt. briefly while rounding ion ICU; pt. lying in bed when [facility] arrived; [facility] learned via video interpreter that pt. is expecting to be discharged this PM but is waiting for RN to remove IV and to receive discharge papers.  Per RN, MD still needs to send discharge order; RN will remove IV.  No further needs at this time.

## 2020-01-15 NOTE — Discharge Summary (Signed)
Physician Discharge Summary  Patrick Wright TTS:[id number] DOB: [birth year] DOA: 01/14/2020  PCP: Langston Reusing, NP  Admit date: 01/14/2020 Discharge date: 01/15/2020  Admitted From: Home  Discharge disposition: Home  Recommendations for Outpatient Follow-Up:   . Follow up with your primary care provider in one week.  . Check CBC, BMP, magnesium in the next visit . Patient will need to take insulin without interruption to avoid hyperglycemic emergency.  Discharge Diagnosis:   Principal Problem:   Hyperosmolar hyperglycemic state (HHS) ([place]) Active Problems:   Diabetes mellitus without complication ([place])   Abdominal pain   Hyperlipidemia   Pancreatitis   Hypertension   Tobacco abuse   Alcohol abuse   Discharge Condition: Improved.  Diet recommendation: Carbohydrate-modified.    Wound care: None.  Code status: Full.   History of Present Illness:   Patrick Wright is a 39 y.o. male with medical history significant of hypertension, hyperlipidemia, diabetes mellitus, alcoholic hepatitis, psoriasis, alcohol abuse, tobacco abuse, presented to the hospital with nausea vomiting abdominal pain and polyuria.  Patient reported that he had not taken his insulin in 1 month. In the ED, he was found to have mildly elevated lipase, urine was negative for UTI, blood sugar 808, bicarbonate 24, anion gap 12, pseudohyponatremia.  Patient was hemodynamically stable., CT of abdomen/pelvis was negative for acute issues.  Chest x-ray was negative.  Patient was placed on stepdown for observation, IV fluids, IV insulin drip.  Hospital Course:   Following conditions were addressed during hospitalization as listed below,  Hyperosmolar hyperglycemic state. Resolved.   initial blood sugar 808, anion gap 12 with normal mental status.  This is likely secondary to noncompliance to NPH.  Patient received IV fluid boluses and DKA protocol with insulin drip.  His blood glucose levels  significantly improved.  Patient was subsequently changed to long-acting insulin and was emphasized the need for compliance to insulin.  His daughter was at bedside.   Diabetes mellitus type II with hyperglycemia and hyperosmolar state. Recent A1c 12.  Poorly controlled.    On Metformin at home has not been taking NPH.  He was emphasized the need for taking insulin at home.   Patient stated that he had all the needles and he did not have insulin so this will be prescribed on discharge.  Abdominal pain:  Mild on presentation.  Has resolved at this time.  Has tolerated oral diet.  Unlikely to be acute pancreatitis.  Patient was emphasized the need for abstaining alcohol.    Hyperlipidemia Resume Lipitor from home.  History of pancreatitis:  Mild abdominal pain on presentation.  Has resolved at this time. Unlikely to be acute pancreatitis at this time.  History of hypertension:  Blood pressure marginally low.  Not on any medication.  Tobacco abuse and Alcohol abuse: Counseling done.  Disposition.  At this time, patient is stable for disposition home.  He was emphasized the need for being compliant with his insulin regimen.  Spoke with the patient's daughter at bedside for need for outpatient PCP followup.  Medical Consultants:    None.  Procedures:    None Subjective:   Today, patient was seen and examined at bedside.  Denies any nausea, vomiting abdominal pain.  Feels better wants to go home and has tolerated diet well.  Discharge Exam:   Vitals:   01/15/20 0800 01/15/20 1400  BP: 100/76   Pulse:    Resp: 16 18  Temp: 98.1 F (36.7 C) 98.4 F (36.9 C)  SpO2:     Vitals:   01/15/20 0500 01/15/20 0600 01/15/20 0800 01/15/20 1400  BP: 93/74 98/73 100/76   Pulse: 89 76    Resp: (!) _0 Temp:   98.1 F (36.7 C) 98.4 F (36.9 C)  TempSrc:   Oral Oral  SpO2: 100% 100%    Weight:      Height:       General: Alert awake, not in obvious distress HENT: pupils  equally reacting to light,  No scleral pallor or icterus noted. Oral mucosa is moist.  Chest:  Clear breath sounds.  Diminished breath sounds bilaterally. No crackles or wheezes.  CVS: S1 &S2 heard. No murmur.  Regular rate and rhythm. Abdomen: Soft, nontender, nondistended.  Nontender on palpation bowel sounds are heard.   Extremities: No cyanosis, clubbing or edema.  Peripheral pulses are palpable. Psych: Alert, awake and oriented, normal mood CNS:  No cranial nerve deficits.  Power equal in all extremities.   Skin: Warm and dry.  No rashes noted.  The results of significant diagnostics from this hospitalization (including imaging, microbiology, ancillary and laboratory) are listed below for reference.     Diagnostic Studies:   DG Chest 2 View  Result Date: 01/14/2020 CLINICAL DATA:  Chest pain.  Rib pain. EXAM: CHEST - 2 VIEW COMPARISON:  08/27/2018. FINDINGS: Mediastinum and hilar structures normal. Heart size normal. Low lung volumes with mild basilar atelectasis. No focal alveolar infiltrate. No pleural effusion or pneumothorax. Stable mild lower thoracic vertebral body compression fracture. No acute bony abnormality identified. IMPRESSION: Low lung volumes with mild basilar atelectasis. No acute bony abnormality identified. No pneumothorax. Electronically Signed   By: Marcello Moores  Register   On: 01/14/2020 12:50   CT ABDOMEN W CONTRAST  Result Date: 01/14/2020 CLINICAL DATA:  Epigastric pain.  Pancreatitis suspected. EXAM: CT ABDOMEN WITH CONTRAST TECHNIQUE: Multidetector CT imaging of the abdomen was performed using the standard protocol following bolus administration of intravenous contrast. CONTRAST:  19m OMNIPAQUE IOHEXOL 300 MG/ML  SOLN COMPARISON:  None. FINDINGS: Lower chest: Unremarkable. Hepatobiliary: No suspicious focal abnormality within the liver parenchyma. Gallbladder nondistended. No intrahepatic or extrahepatic biliary dilation. Pancreas: No focal mass lesion. No dilatation  of the main duct. No intraparenchymal cyst. No peripancreatic edema. Spleen: No splenomegaly. No focal mass lesion. Adrenals/Urinary Tract: No adrenal nodule or mass. Kidneys unremarkable. Stomach/Bowel: Stomach is unremarkable. No gastric wall thickening. No evidence of outlet obstruction. Duodenum is normally positioned as is the ligament of Treitz. No small bowel or colonic dilatation within the visualized abdomen. Vascular/Lymphatic: No abdominal aortic aneurysm. There is no gastrohepatic or hepatoduodenal ligament lymphadenopathy. No retroperitoneal or mesenteric lymphadenopathy. Other: No intraperitoneal free fluid. Musculoskeletal: No worrisome lytic or sclerotic osseous abnormality. IMPRESSION: No acute findings in the abdomen. Specifically, no findings to explain the patient's history of epigastric pain. Pancreas unremarkable. Electronically Signed   By: EMisty StanleyM.D.   On: 01/14/2020 14:06     Labs:   Basic Metabolic Panel: Recent Labs  Lab 01/14/20 1513 01/14/20 1513 01/14/20 1851 01/14/20 1851 01/14/20 2305 01/14/20 2305 01/15/20 0256 01/15/20 0700  NA 135  --  138  --  140  --  140 139  K 3.0*   < > 3.5   < > 3.2*   < > 3.5 3.8  CL 103  --  103  --  104  --  106 106  CO2 25  --  28  --  28  --  27  26  GLUCOSE 325*  --  142*  --  172*  --  116* 162*  BUN 13  --  12  --  12  --  13 14  CREATININE 0.71  --  0.72  --  0.66  --  0.64 0.63  CALCIUM 8.5*  --  8.8*  --  8.7*  --  8.6* 8.7*   < > = values in this interval not displayed.   GFR Estimated Creatinine Clearance: 93.7 mL/min (by C-G formula based on SCr of 0.63 mg/dL). Liver Function Tests: Recent Labs  Lab 01/14/20 1140  AST 22  ALT 29  ALKPHOS 147*  BILITOT 1.2  PROT 7.8  ALBUMIN 4.5   Recent Labs  Lab 01/14/20 1140  LIPASE 134*   No results for input(s): AMMONIA in the last 168 hours. Coagulation profile No results for input(s): INR, PROTIME in the last 168 hours.  CBC: Recent Labs  Lab  01/14/20 1140 01/15/20 0700  WBC 5.9 7.1  HGB 15.6 13.6  HCT 43.1 38.3*  MCV 90.0 91.4  PLT 266 253   Cardiac Enzymes: No results for input(s): CKTOTAL, CKMB, CKMBINDEX, TROPONINI in the last 168 hours. BNP: Invalid input(s): POCBNP CBG: Recent Labs  Lab 01/15/20 0503 01/15/20 0651 01/15/20 0901 01/15/20 1039 01/15/20 1122  GLUCAP 118* 135* 161* 163* 119*   D-Dimer No results for input(s): DDIMER in the last 72 hours. Hgb A1c No results for input(s): HGBA1C in the last 72 hours. Lipid Profile Recent Labs    01/14/20 1513  TRIG 354*   Thyroid function studies No results for input(s): TSH, T4TOTAL, T3FREE, THYROIDAB in the last 72 hours.  Invalid input(s): FREET3 Anemia work up No results for input(s): VITAMINB12, FOLATE, FERRITIN, TIBC, IRON, RETICCTPCT in the last 72 hours. Microbiology Recent Results (from the past 240 hour(s))  Resp Panel by RT-PCR (Flu A&B, Covid) Nasopharyngeal Swab     Status: None   Collection Time: 01/14/20  1:16 PM   Specimen: Nasopharyngeal Swab; Nasopharyngeal(NP) swabs in vial transport medium  Result Value Ref Range Status   SARS Coronavirus 2 by RT PCR NEGATIVE NEGATIVE Final    Comment: (NOTE) SARS-CoV-2 target nucleic acids are NOT DETECTED.  The SARS-CoV-2 RNA is generally detectable in upper respiratory specimens during the acute phase of infection. The lowest concentration of SARS-CoV-2 viral copies this assay can detect is 138 copies/mL. A negative result does not preclude SARS-Cov-2 infection and should not be used as the sole basis for treatment or other patient management decisions. A negative result may occur with  improper specimen collection/handling, submission of specimen other than nasopharyngeal swab, presence of viral mutation(s) within the areas targeted by this assay, and inadequate number of viral copies(<138 copies/mL). A negative result must be combined with clinical observations, patient history, and  epidemiological information. The expected result is Negative.  Fact Sheet for Patients:  EntrepreneurPulse.com.au  Fact Sheet for Healthcare Providers:  IncredibleEmployment.be  This test is no t yet approved or cleared by the [place] FDA and  has been authorized for detection and/or diagnosis of SARS-CoV-2 by FDA under an Emergency Use Authorization (EUA). This EUA will remain  in effect (meaning this test can be used) for the duration of the COVID-19 declaration under Section 564(b)(1) of the Act, 21 U.S.C.section 360bbb-3(b)(1), unless the authorization is terminated  or revoked sooner.       Influenza A by PCR NEGATIVE NEGATIVE Final   Influenza B by PCR NEGATIVE NEGATIVE Final  Comment: (NOTE) The Xpert Xpress SARS-CoV-2/FLU/RSV plus assay is intended as an aid in the diagnosis of influenza from Nasopharyngeal swab specimens and should not be used as a sole basis for treatment. Nasal washings and aspirates are unacceptable for Xpert Xpress SARS-CoV-2/FLU/RSV testing.  Fact Sheet for Patients: EntrepreneurPulse.com.au  Fact Sheet for Healthcare Providers: IncredibleEmployment.be  This test is not yet approved or cleared by the [place] FDA and has been authorized for detection and/or diagnosis of SARS-CoV-2 by FDA under an Emergency Use Authorization (EUA). This EUA will remain in effect (meaning this test can be used) for the duration of the COVID-19 declaration under Section 564(b)(1) of the Act, 21 U.S.C. section 360bbb-3(b)(1), unless the authorization is terminated or revoked.  Performed at [facility], [address] [place], [place] [address]   MRSA PCR Screening     Status: None   Collection Time: 01/14/20  8:35 PM   Specimen: Nasopharyngeal  Result Value Ref Range Status   MRSA by PCR NEGATIVE NEGATIVE Final    Comment:        The GeneXpert MRSA Assay  (FDA approved for NASAL specimens only), is one component of a comprehensive MRSA colonization surveillance program. It is not intended to diagnose MRSA infection nor to guide or monitor treatment for MRSA infections. Performed at [facility], [address] [place], [place] [address]      Discharge Instructions:   Discharge Instructions    Call MD for:  persistant nausea and vomiting   Complete by: As directed    Call MD for:  severe uncontrolled pain   Complete by: As directed    Diet Carb Modified   Complete by: As directed    Discharge instructions   Complete by: As directed    Please avoid alcohol or smoking.  Continue to take insulin without interruption.  Please follow-up with your primary care physician within 1 week to check your blood work and adjust medications as necessary.   Increase activity slowly   Complete by: As directed      Allergies as of 01/15/2020   No Known Allergies     Medication List    TAKE these medications   atorvastatin 40 MG tablet Commonly known as: LIPITOR Take 1 tablet (40 mg total) by mouth daily at 6 PM.   blood glucose meter kit and supplies Kit Dispense based on patient and insurance preference. Use up to four times daily as directed. (FOR ICD-9 250.00, 250.01).   HumuLIN 70/30 (70-30) 100 UNIT/ML injection Generic drug: insulin NPH-regular Human Inject 10 Units into the skin as directed. Take 10 units with breakfast and 10 units with evening meal.   metFORMIN 1000 MG tablet Commonly known as: GLUCOPHAGE Take 1 tablet (1,000 mg total) by mouth 2 (two) times daily with a meal.   triamcinolone 0.1 % Commonly known as: KENALOG Apply 1 application topically 2 (two) times daily.       Follow-up Information    Iloabachie, Chioma E, NP Follow up in 1 week(s).   Specialty: Gerontology Why: For regular checkup, blood work, Sport and exercise psychologist information: [facility]  [phone]                Time coordinating discharge: 39 minutes  Signed:  Kaesha Kirsch  [facility] 01/15/2020, 3:26 PM

## 2020-01-15 NOTE — Progress Notes (Addendum)
Inpatient Diabetes Program Recommendations  AACE/ADA: New Consensus Statement on Inpatient Glycemic Control (2015)  Target Ranges:  Prepandial:   less than 140 mg/dL      Peak postprandial:   less than 180 mg/dL (1-2 hours)      Critically ill patients:  140 - 180 mg/dL   Lab Results  Component Value Date   GLUCAP 161 (H) 01/15/2020   HGBA1C 12.1 (H) 03/28/2019    Review of Glycemic Control  Diabetes history: DM2 Outpatient Diabetes medications: Humulin 70/30 insulin 10 units bid + Metformin 1 gm bid Current orders for Inpatient glycemic control: IV insulin  Inpatient Diabetes Program Recommendations:   When patient ready to transition from IV insulin: -Levemir 8 units bid -Novolog sensitive correction 0-9 units q 4 hrs. While NPO then tid + hs 0-5 units -Transition back to 70/30 10 units bid when eating Secure chat to Dr. Louanne Belton Ordered Living Well With Diabetes Spanish version.  Spoke with patient using interpretor # [id number] and RN Benjaman Lobe. Patient had been seeing PCP and receiving medications including insulin from [facility] but unable to provide required papers to continue treatment there. Transition of Care consult placed. Patient willing to purchase Novolin Relion insulin 70/30 in vial from [facility] for approximately $25 per vial. Patient states he prefers vials due to already having several syringes @ home.  Novolin Relion insulin vial Order # [id number]  Thank you, Nani Gasser. Haidy Kackley, RN, MSN, CDE  Diabetes Coordinator Inpatient Glycemic Control Team Team Pager [phone] (8am-5pm) 01/15/2020 10:14 AM

## 2020-01-15 NOTE — Plan of Care (Signed)
  Problem: Education: Goal: Ability to describe self-care measures that may prevent or decrease complications (Diabetes Survival Skills Education) will improve Outcome: Adequate for Discharge Goal: Individualized Educational Video(s) Outcome: Adequate for Discharge   Problem: Health Behavior/Discharge Planning: Goal: Ability to manage health-related needs will improve Outcome: Adequate for Discharge   Problem: Metabolic: Goal: Ability to maintain appropriate glucose levels will improve Outcome: Adequate for Discharge   Problem: Education: Goal: Knowledge of General Education information will improve Description: Including pain rating scale, medication(s)/side effects and non-pharmacologic comfort measures Outcome: Adequate for Discharge   Problem: Health Behavior/Discharge Planning: Goal: Ability to manage health-related needs will improve Outcome: Adequate for Discharge   Problem: Clinical Measurements: Goal: Ability to maintain clinical measurements within normal limits will improve Outcome: Adequate for Discharge Goal: Will remain free from infection Outcome: Adequate for Discharge Goal: Diagnostic test results will improve Outcome: Adequate for Discharge Goal: Respiratory complications will improve Outcome: Adequate for Discharge Goal: Cardiovascular complication will be avoided Outcome: Adequate for Discharge   Problem: Activity: Goal: Risk for activity intolerance will decrease Outcome: Adequate for Discharge   Problem: Nutrition: Goal: Adequate nutrition will be maintained Outcome: Adequate for Discharge   Problem: Coping: Goal: Level of anxiety will decrease Outcome: Adequate for Discharge   Problem: Elimination: Goal: Will not experience complications related to bowel motility Outcome: Adequate for Discharge Goal: Will not experience complications related to urinary retention Outcome: Adequate for Discharge   Problem: Pain Managment: Goal: General  experience of comfort will improve Outcome: Adequate for Discharge   Problem: Safety: Goal: Ability to remain free from injury will improve Outcome: Adequate for Discharge   Problem: Skin Integrity: Goal: Risk for impaired skin integrity will decrease Outcome: Adequate for Discharge

## 2020-01-15 NOTE — Final Progress Note (Signed)
Discharge orders noted, all IV lines/drains removed. Pt is alert and oriented, voices no complaints. Discharge orders reviewed with patient via video remote interpreter, pt questions answered with the same.  Pt is pleasant and stated that all his questions were answered. Pt has all belongings including his wallet, cell phone and charger. Pt is afebrile,  Vs are stable, and no s/sx of hyper/hypoglycemia at this time. Discharge paperwork given to pt in spanish for his review. Pt acknowledges that he is to pick up his insulin RX from [facility] and make a f/u appt. in 1 week with a PCP.  Pt d/c from unit via w/c, states his ride is on the way.

## 2020-01-17 LAB — HEMOGLOBIN A1C
Hgb A1c MFr Bld: 15.5 % — ABNORMAL HIGH (ref 4.8–5.6)
Mean Plasma Glucose: 398 mg/dL

## 2020-01-22 ENCOUNTER — Ambulatory Visit: Payer: Self-pay | Admitting: Adult Health

## 2020-01-22 ENCOUNTER — Telehealth: Payer: Self-pay | Admitting: Gerontology

## 2020-01-22 NOTE — Telephone Encounter (Signed)
Left message for pt to reschedule missed appt from 12/1 - MD 12/1

## 2020-04-09 ENCOUNTER — Telehealth: Payer: Self-pay | Admitting: Gerontology

## 2020-04-09 NOTE — Telephone Encounter (Signed)
The -[place] phone number was out of service at 5 pm on 04/09/20-KW

## 2020-04-16 ENCOUNTER — Telehealth: Payer: Self-pay | Admitting: Gerontology

## 2020-04-16 NOTE — Telephone Encounter (Signed)
Called pt with interpreter to try to schedule a follow up appt, but apparently the line was out of service. Unable to LVM.   MD 04/16/20 @ 3:30

## 2020-09-11 ENCOUNTER — Other Ambulatory Visit: Payer: Self-pay

## 2020-11-12 ENCOUNTER — Emergency Department
Admission: EM | Admit: 2020-11-12 | Discharge: 2020-11-12 | Disposition: A | Discharge status: Home / Self Care | Payer: Self-pay | Attending: Emergency Medicine | Admitting: Emergency Medicine

## 2020-11-12 ENCOUNTER — Encounter: Payer: Self-pay | Admitting: Medical Oncology

## 2020-11-12 ENCOUNTER — Emergency Department: Payer: Self-pay

## 2020-11-12 DIAGNOSIS — R0781 Pleurodynia: Secondary | ICD-10-CM | POA: Insufficient documentation

## 2020-11-12 DIAGNOSIS — Z5321 Procedure and treatment not carried out due to patient leaving prior to being seen by health care provider: Secondary | ICD-10-CM | POA: Insufficient documentation

## 2020-11-12 LAB — CBC
Hemoglobin: 15.6 g/dL (ref 13.0–17.0)
Platelets: 276 10*3/uL (ref 150–400)
WBC: 7.2 10*3/uL (ref 4.0–10.5)

## 2020-11-12 LAB — BASIC METABOLIC PANEL
Anion gap: 8 (ref 5–15)
BUN: 16 mg/dL (ref 6–20)
CO2: 23 mmol/L (ref 22–32)
Calcium: 9.4 mg/dL (ref 8.9–10.3)
Chloride: 99 mmol/L (ref 98–111)
Creatinine, Ser: 0.64 mg/dL (ref 0.61–1.24)
GFR, Estimated: >60 mL/min (ref >60)
Glucose, Bld: 594 mg/dL (ref 70–99)
Potassium: 4.2 mmol/L (ref 3.5–5.1)
Sodium: 130 mmol/L — ABNORMAL LOW (ref 135–145)

## 2020-11-12 LAB — TROPONIN I (HIGH SENSITIVITY): Troponin I (High Sensitivity): 3 ng/L (ref <18)

## 2020-11-12 NOTE — ED Provider Notes (Signed)
Emergency Medicine Provider Triage Evaluation Note  Clerance Umland , a 40 y.o. male  was evaluated in triage.  Pt complains of complaining of left-sided chest/rib pain.  Symptoms have been ongoing x2 to 3 weeks.  No reported trauma.  No cardiac history.  Does not appear to be worsened with movement or breathing.  No history of DVT or PE.Marland Kitchen  No URI symptoms or cough.  Review of Systems  Positive: Left-sided chest/rib pain Negative: No shortness of breath, cough, fevers or chills, URI symptoms, GI complaints  Physical Exam  BP 106/75 (BP Location: Left Arm)   Pulse 82   Temp 98.1 F (36.7 C) (Oral)   Resp 16   Wt 54 kg   SpO2 100%   BMI 21.77 kg/m  Gen:   Awake, no distress   Resp:  Normal effort  MSK:   Moves extremities without difficulty  Other:  No murmurs, rubs, gallops.  No adventitious lung sounds.  Patient is slightly tender to palpation along the left chest wall without palpable abnormality or crepitus.  Medical Decision Making  Medically screening exam initiated at 3:08 PM.  Appropriate orders placed.  Rollan Elveria Royals was informed that the remainder of the evaluation will be completed by another provider, this initial triage assessment does not replace that evaluation, and the importance of remaining in the ED until their evaluation is complete.  Patient arrives with complaint of left-sided chest pain x2 weeks.  Diabetic.  No shortness of breath or URI symptoms.  No cardiac history.  No history of DVT or PE.  Patient will have basic labs, troponin, EKG and chest x-ray.   Brynda Peon 11/12/20 1508    Blake Divine, MD 11/12/20 1743

## 2020-11-12 NOTE — ED Triage Notes (Signed)
Using Spanish Interpreter:Albert [id number]- PT c/o pain to left rib cage x 2 weeks, no known injury. Pt also concerned that he is diabetic and has not been on his meds x 1 year. Pt denies cough.

## 2021-02-09 ENCOUNTER — Other Ambulatory Visit: Payer: Self-pay

## 2021-07-07 ENCOUNTER — Other Ambulatory Visit: Payer: Self-pay

## 2021-07-07 ENCOUNTER — Emergency Department: Payer: Self-pay

## 2021-07-07 ENCOUNTER — Emergency Department
Admission: EM | Admit: 2021-07-07 | Discharge: 2021-07-07 | Disposition: A | Discharge status: Home / Self Care | Payer: Self-pay | Attending: Emergency Medicine | Admitting: Emergency Medicine

## 2021-07-07 DIAGNOSIS — R739 Hyperglycemia, unspecified: Secondary | ICD-10-CM

## 2021-07-07 DIAGNOSIS — E1165 Type 2 diabetes mellitus with hyperglycemia: Secondary | ICD-10-CM | POA: Insufficient documentation

## 2021-07-07 DIAGNOSIS — K529 Noninfective gastroenteritis and colitis, unspecified: Secondary | ICD-10-CM | POA: Insufficient documentation

## 2021-07-07 DIAGNOSIS — A0811 Acute gastroenteropathy due to Norwalk agent: Secondary | ICD-10-CM

## 2021-07-07 LAB — GASTROINTESTINAL PANEL BY PCR, STOOL (REPLACES STOOL CULTURE)

## 2021-07-07 LAB — COMPREHENSIVE METABOLIC PANEL
ALT: 35 U/L (ref 0–44)
AST: 28 U/L (ref 15–41)
Albumin: 4.1 g/dL (ref 3.5–5.0)
Alkaline Phosphatase: 112 U/L (ref 38–126)
Anion gap: 11 (ref 5–15)
BUN: 16 mg/dL (ref 6–20)
CO2: 24 mmol/L (ref 22–32)
Calcium: 9.3 mg/dL (ref 8.9–10.3)
Chloride: 97 mmol/L — ABNORMAL LOW (ref 98–111)
Creatinine, Ser: 0.72 mg/dL (ref 0.61–1.24)
GFR, Estimated: >60 mL/min (ref >60)
Glucose, Bld: 542 mg/dL (ref 70–99)
Potassium: 3.4 mmol/L — ABNORMAL LOW (ref 3.5–5.1)
Sodium: 132 mmol/L — ABNORMAL LOW (ref 135–145)
Total Bilirubin: 0.5 mg/dL (ref 0.3–1.2)
Total Protein: 7.3 g/dL (ref 6.5–8.1)

## 2021-07-07 LAB — CBC
HCT: 38.2 % — ABNORMAL LOW (ref 39.0–52.0)
Hemoglobin: 13.8 g/dL (ref 13.0–17.0)
MCH: 32.4 pg (ref 26.0–34.0)
MCHC: 36.1 g/dL — ABNORMAL HIGH (ref 30.0–36.0)
MCV: 89.7 fL (ref 80.0–100.0)
Platelets: 301 10*3/uL (ref 150–400)
RBC: 4.26 MIL/uL (ref 4.22–5.81)
RDW: 12.1 % (ref 11.5–15.5)
WBC: 11.4 10*3/uL — ABNORMAL HIGH (ref 4.0–10.5)
nRBC: 0 % (ref 0.0–0.2)

## 2021-07-07 LAB — CBG MONITORING, ED
Glucose-Capillary: 434 mg/dL — ABNORMAL HIGH (ref 70–99)
Glucose-Capillary: 384 mg/dL — ABNORMAL HIGH (ref 70–99)

## 2021-07-07 LAB — C DIFFICILE QUICK SCREEN W PCR REFLEX
C Diff antigen: NEGATIVE
C Diff interpretation: NOT DETECTED
C Diff toxin: NEGATIVE

## 2021-07-07 LAB — URINALYSIS, ROUTINE W REFLEX MICROSCOPIC
Bacteria, UA: NONE SEEN
Bilirubin Urine: NEGATIVE
Glucose, UA: >=500 mg/dL — AB
Hgb urine dipstick: NEGATIVE
Ketones, ur: NEGATIVE mg/dL
Leukocytes,Ua: NEGATIVE
Nitrite: NEGATIVE
Protein, ur: NEGATIVE mg/dL
Specific Gravity, Urine: 1.035 — ABNORMAL HIGH (ref 1.005–1.030)
Squamous Epithelial / HPF: NONE SEEN (ref 0–5)
pH: 6 (ref 5.0–8.0)

## 2021-07-07 LAB — LIPASE, BLOOD: Lipase: 66 U/L — ABNORMAL HIGH (ref 11–51)

## 2021-07-07 LAB — LACTIC ACID, PLASMA: Lactic Acid, Venous: 1.7 mmol/L (ref 0.5–1.9)

## 2021-07-07 MED ORDER — KETOROLAC TROMETHAMINE 15 MG/ML IJ SOLN
15.0000 mg | Freq: Once | INTRAMUSCULAR | Status: AC
  Administered 2021-07-07: 15 mg via INTRAVENOUS
  Filled 2021-07-07: qty 1

## 2021-07-07 MED ORDER — DICYCLOMINE HCL 10 MG PO CAPS
10.0000 mg | ORAL_CAPSULE | Freq: Three times a day (TID) | ORAL | 0 refills | Status: DC | PRN
  Filled 2021-07-07: qty 20, 7d supply, fill #0

## 2021-07-07 MED ORDER — FENTANYL CITRATE PF 50 MCG/ML IJ SOSY
50.0000 ug | PREFILLED_SYRINGE | Freq: Once | INTRAMUSCULAR | Status: AC
  Administered 2021-07-07: 50 ug via INTRAVENOUS
  Filled 2021-07-07: qty 1

## 2021-07-07 MED ORDER — SODIUM CHLORIDE 0.9 % IV BOLUS
1000.0000 mL | Freq: Once | INTRAVENOUS | Status: AC
  Administered 2021-07-07: 1000 mL via INTRAVENOUS

## 2021-07-07 MED ORDER — SODIUM CHLORIDE 0.9 % IV BOLUS
1000.0000 mL | Freq: Once | INTRAVENOUS | Status: AC
Start: 2021-07-07 — End: 2021-07-07
  Administered 2021-07-07: 1000 mL via INTRAVENOUS

## 2021-07-07 MED ORDER — IOHEXOL 300 MG/ML  SOLN
100.0000 mL | Freq: Once | INTRAMUSCULAR | Status: AC | PRN
  Administered 2021-07-07: 100 mL via INTRAVENOUS

## 2021-07-07 MED ORDER — ONDANSETRON HCL 4 MG/2ML IJ SOLN
4.0000 mg | Freq: Once | INTRAMUSCULAR | Status: AC
  Administered 2021-07-07: 4 mg via INTRAVENOUS
  Filled 2021-07-07: qty 2

## 2021-07-07 NOTE — ED Notes (Signed)
Dc ppw provided and reviewed with spanish interpretor. Pt provides verbal consent for dc and all questions answered at this time. Pt ambulatory off unit on foot alert and oriented. ?

## 2021-07-07 NOTE — Discharge Instructions (Addendum)
Follow-up with GI to get a colonoscopy and discuss this further and return to the ER if you develop worsening symptoms or any other concerns. As we discussed your glucose level is significantly elevated and you need to follow-up with [facility] to get restarted on your insulin  ? ? ?1. Diffuse rectosigmoid colon wall thickening and surrounding ?inflammation suspicious for colitis. This could be on the basis of ?infectious colitis or inflammatory bowel disease (ulcerative colitis ?most common with this distribution). Although there is a potential ?inflamed proximal sigmoid colon diverticulum, the inflammatory ?changes are more diffuse than that typically seen with ?diverticulitis. Questionable mild diffuse small bowel wall ?thickening. ?2. No evidence of bowel perforation, obstruction or abscess. ?3. 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 Suggest further evaluation with complete chest ?CTA (which could be performed at a later date). ?

## 2021-07-07 NOTE — ED Provider Notes (Addendum)
? ?[facility] ?Provider Note ? ? ? Event Date/Time  ? First MD Initiated Contact with Patient 07/07/21 1254   ?  (approximate) ? ? ?History  ? ?Abdominal Pain and Diarrhea ? ? ?HPI ? ?Patrick Wright is a 41 y.o. male who comes in with left-sided abdominal pain and diarrhea for the past 3 days.  Patient reports that since he was last seen in the ER he had worsening abdominal pain in his left lower quadrant and he has had 10 episodes of diarrhea every day.  He reports being on an antibiotic from [place] without any improvements.  He denies any nausea, vomiting.  He reports that he was previously seen in for [facility] but had some issues getting back in due to not filling out the paperwork.  He reports taking his metformin but he is not currently taking any insulin.  He reports taking at 1000 mg of metformin twice daily for a long time and this was not recently changed and that he denies the diarrhea has gotten worse with that. ? ?I reviewed the records were patient was seen in outside ER on 06/28/2021 where patient had a CT scan of the abdomen that was negative and they recommended increasing the metformin to 1 g 2 times daily. ? ? ?Physical Exam  ? ?Triage Vital Signs: ?ED Triage Vitals  ?Enc Vitals Group  ?   BP 07/07/21 1144 114/83  ?   Pulse Rate 07/07/21 1144 (!) 108  ?   Resp 07/07/21 1144 18  ?   Temp 07/07/21 1145 98 ?F (36.7 ?C)  ?   Temp Source 07/07/21 1145 Oral  ?   SpO2 07/07/21 1144 100 %  ?   Weight 07/07/21 1144 120 lb (54.4 kg)  ?   Height 07/07/21 1304 5' 2"  (1.575 m)  ?   Head Circumference --   ?   Peak Flow --   ?   Pain Score 07/07/21 1144 7  ?   Pain Loc --   ?   Pain Edu? --   ?   Excl. in [place]? --   ? ? ?Most recent vital signs: ?Vitals:  ? 07/07/21 1145 07/07/21 1309  ?BP:  115/68  ?Pulse:  99  ?Resp:  18  ?Temp: 98 ?F (36.7 ?C)   ?SpO2:  100%  ? ? ? ?General: Awake, no distress.  ?CV:  Good peripheral perfusion.  ?Resp:  Normal effort.   ?Abd:  No distention.  Tender in the left lower quadrant ?Other:   ? ? ?ED Results / Procedures / Treatments  ? ?Labs ?(all labs ordered are listed, but only abnormal results are displayed) ?Labs Reviewed  ?LIPASE, BLOOD - Abnormal; Notable for the following components:  ?    Result Value  ? Lipase 66 (*)   ? All other components within normal limits  ?COMPREHENSIVE METABOLIC PANEL - Abnormal; Notable for the following components:  ? Sodium 132 (*)   ? Potassium 3.4 (*)   ? Chloride 97 (*)   ? Glucose, Bld 542 (*)   ? All other components within normal limits  ?CBC - Abnormal; Notable for the following components:  ? WBC 11.4 (*)   ? HCT 38.2 (*)   ? MCHC 36.1 (*)   ? All other components within normal limits  ?URINALYSIS, ROUTINE W REFLEX MICROSCOPIC - Abnormal; Notable for the following components:  ? Color, Urine COLORLESS (*)   ? APPearance CLEAR (*)   ?  Specific Gravity, Urine 1.035 (*)   ? Glucose, UA >=500 (*)   ? All other components within normal limits  ? ? ? ?RADIOLOGY ?I have reviewed the CT personally interpreted with looks concerning catheter concerning for colitis ? ?1. Diffuse rectosigmoid colon wall thickening and surrounding ?inflammation suspicious for colitis. This could be on the basis of ?infectious colitis or inflammatory bowel disease (ulcerative colitis ?most common with this distribution). Although there is a potential ?inflamed proximal sigmoid colon diverticulum, the inflammatory ?changes are more diffuse than that typically seen with ?diverticulitis. Questionable mild diffuse small bowel wall ?thickening. ?2. No evidence of bowel perforation, obstruction or abscess. ?3. 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 Suggest further evaluation with complete chest ?CTA (which could be performed at a later date). ? ?PROCEDURES: ? ?Critical Care performed:  No ? ?Procedures ? ? ?MEDICATIONS ORDERED IN ED: ?Medications - No data to display ? ? ?IMPRESSION / MDM / ASSESSMENT AND PLAN / ED COURSE  ?I reviewed the triage vital signs and the nursing notes. ? ? ?This is concerning for acute pathology that could be life threatening and given my differential includes such as diverticulitis, abscess, C. difficile.  We will proceed with stool testing given the multitude episodes not getting better after 3 weeks as well as CT imaging ? ?CT scan is concerning for colitis.  There is concern for needing outpatient CTA which I did discuss with patient with the interpreter. ? ?Lipase is slightly elevated.  Glucose elevated at 540 and patient getting some IV fluids.  CBC shows white count of 11.4 urine without evidence of UTI ? ?Had a lengthy discussion with patient about the need to [facility] with [facility] to discuss his diabetes medications to have been further adjusted given how hyperglycemic he is ? ?CT imaging concerning for colitis.  Patient provided copy of results for incidental findings.  Will get lactate but does not sound like ischemic colitis.  Stool studies are pending and will want those to result prior to discharging patient given the concern for colitis on CT imaging. ? ?3:29 PM ? Pain is better given some toradol.  Normal lactate, no blood in stool handed off to on coming pending stool studies.  Discussed follow-up with GI for further work-up ? ? ?Patient understands the importance of following up with [facility] for his hyperglycemia. ? ? ?Patient handed off to oncoming team pending these results ? ?FINAL CLINICAL IMPRESSION(S) / ED DIAGNOSES  ? ?Final diagnoses:  ?Hyperglycemia  ?Colitis  ? ? ? ?Rx / DC Orders  ? ?ED Discharge Orders   ? ? None  ? ?  ? ? ? ?Note:  This document was prepared using Dragon voice recognition software and may include unintentional dictation errors. ?  ?Vanessa [place], MD ?07/07/21 1449 ? ?  ?Vanessa , MD ?07/07/21 1530 ? ?

## 2021-07-07 NOTE — ED Notes (Signed)
Patient provided with urine cup for specimen collection.  ?

## 2021-07-07 NOTE — ED Triage Notes (Signed)
Pt comes pov with left sided abd pain and diarrhea for 3 weeks. States that every time after he eats he has diarrhea and cramping pain. Denies any recent antibiotics.  ?

## 2021-07-07 NOTE — ED Notes (Signed)
Provider Jari Pigg notified via secure chat that Patrick Wright continues to c/o abdominal cramping. Patrick Wright laying calmly on stretcher; skin dry; resp reg/unlabored. Awaiting reply from provider. ?

## 2021-07-08 ENCOUNTER — Other Ambulatory Visit: Payer: Self-pay

## 2021-07-09 ENCOUNTER — Telehealth: Payer: Self-pay | Admitting: Pharmacy Technician

## 2021-07-09 NOTE — Telephone Encounter (Signed)
Patient only signed [facility] Attestation.  Would need to provide current year's household income if PAP medications were needed.  Jacquelynn Cree Patient Advocate Specialist [employer] at [facility]

## 2021-07-29 ENCOUNTER — Ambulatory Visit: Payer: Self-pay | Admitting: Gerontology

## 2021-07-29 VITALS — BP 105/72 | HR 90 | Temp 98.0°F | Resp 16 | Ht 61.0 in | Wt 112.0 lb

## 2021-07-29 DIAGNOSIS — E0865 Diabetes mellitus due to underlying condition with hyperglycemia: Secondary | ICD-10-CM

## 2021-07-29 LAB — POCT GLYCOSYLATED HEMOGLOBIN (HGB A1C): Hemoglobin A1C: 14 % — AB (ref 4.0–5.6)

## 2021-07-29 LAB — GLUCOSE, POCT (MANUAL RESULT ENTRY)

## 2021-07-29 MED ORDER — BASAGLAR KWIKPEN 100 UNIT/ML ~~LOC~~ SOPN
13.0000 [IU] | PEN_INJECTOR | Freq: Every day | SUBCUTANEOUS | 3 refills | Status: DC
  Filled 2021-07-29: qty 3, 23d supply, fill #0
  Filled 2021-08-20: qty 3, 23d supply, fill #1
  Filled 2021-10-11: qty 3, 23d supply, fill #2

## 2021-07-29 MED ORDER — GLIPIZIDE 5 MG PO TABS
5.0000 mg | ORAL_TABLET | Freq: Two times a day (BID) | ORAL | 0 refills | Status: DC
  Filled 2021-07-29: qty 30, 15d supply, fill #0

## 2021-07-29 NOTE — Patient Instructions (Signed)
Recuento de carbohidratos para la diabetes mellitus en los adultos Carbohydrate Counting for Diabetes Mellitus, Adult El recuento de carbohidratos es un mtodo para llevar un registro de la cantidad de carbohidratos que se ingieren. La ingesta de carbohidratos aumenta la cantidad de azcar (glucosa) en la sangre. El recuento de la cantidad de carbohidratos que ingiere mejora el control de su glucemia. Esto, a su vez, le ayuda a controlar su diabetes. Los carbohidratos se miden en gramos (g) por porcin. Es importante saber la cantidad de carbohidratos (en gramos o por tamao de porcin) que se puede ingerir en cada comida. Esto es Psychologist, forensic. Un nutricionista puede ayudarlo a crear un plan de alimentacin y a calcular la cantidad de carbohidratos que debe ingerir en cada comida y colacin. Qu alimentos contienen carbohidratos? Los siguientes alimentos incluyen carbohidratos: Granos, como panes y cereales. Frijoles secos y productos con soja. Verduras con almidn, como papas, guisantes y maz. Lambert Mody y jugos de frutas. Leche y Estate agent. Dulces y colaciones, como pasteles, galletas, caramelos, papas fritas de bolsa y refrescos. Cmo se calculan los carbohidratos de los alimentos? Hay dos maneras de calcular los carbohidratos de los alimentos. Puede leer las etiquetas de los alimentos o aprender cules son los tamaos de las porciones estndar de los alimentos. Puede usar cualquiera de estos mtodos o una combinacin de [place]. Usar la Scientific laboratory technician de informacin nutricional La lista de Informacin nutricional est incluida en las etiquetas de casi todas las bebidas y los alimentos envasados de los [place]. Esto incluye lo siguiente: El tamao de la porcin. Informacin sobre los nutrientes de cada porcin, incluidos los gramos de carbohidratos por porcin. Para usar la Informacin nutricional, decida cuntas porciones tomar. Luego, multiplique el nmero de porciones por la cantidad  de carbohidratos por porcin. El nmero resultante es la cantidad total de carbohidratos que comer. Conocer los tamaos de las porciones estndar de los alimentos Cuando coma alimentos que contengan carbohidratos y que no estn envasados o no incluyan la informacin nutricional en la etiqueta, debe medir las porciones para poder calcular los gramos de carbohidratos. Mida los alimentos que comer con una balanza de alimentos o una taza medidora, si es necesario. Decida cuntas porciones de International aid/development worker. Multiplique el nmero de porciones por 15. En los alimentos que contienen carbohidratos, una porcin University a 15 g de carbohidratos. Por ejemplo, si come 2 tazas o 10 onzas (300 g) de fresas, habr comido 2 porciones y 30 g de carbohidratos (2 porciones x 15 g = 30 g). En el caso de las comidas que contienen mezclas de ms de un alimento, como las sopas y los guisos, debe calcular los carbohidratos de cada alimento que se incluye. La siguiente lista contiene los tamaos de porciones estndar de los alimentos ricos en carbohidratos ms comunes. Cada una de estas porciones tiene aproximadamente 15 g de carbohidratos: 1 rebanada de pan. 1 tortilla de seis pulgadas (15 cm). ? de taza o 2 onzas (53 g) de arroz o pasta cocidos.  taza o 3 onzas (85 g) de lentejas o frijoles cocidos o enlatados, escurridos y enjuagados.  taza o 3 onzas (85 g) de verduras con almidn, como guisantes, maz o zapallo.  taza o 4 onzas (120 g) de cereal caliente.  taza o 3 onzas (85 g) de papas hervidas o en pur, o  o 3 onzas (85 g) de una papa grande al horno.  taza o 4 onzas fluidas (118 ml) de jugo de frutas. 1 taza u  8 onzas fluidas (237 ml) de leche. 1 unidad pequea o 4 onzas (106 g) de manzana.  unidad o 2 onzas (63 g) de una banana mediana. 1 taza o 5 oz (150 g) de fresas. 3 tazas o 1 oz (28.3 g) de palomitas de maz. Cul sera un ejemplo de recuento de carbohidratos? Para calcular los gramos  de carbohidratos de este ejemplo de comida, siga los pasos que se describen a continuacin. Ejemplo de comida 3 onzas (85 g) de pechugas de pollo. ? de taza o 4 onzas (106 g) de arroz integral.  taza o 3 onzas (85 g) de maz. 1 taza u 8 onzas fluidas (237 ml) de leche. 1 taza o 5 onzas (150 g) de fresas con crema batida sin azcar. Clculo de carbohidratos Identifique los alimentos que contienen carbohidratos: Arroz. Maz. Leche. Hughie Closs. Calcule cuntas porciones come de cada alimento: 2 porciones de arroz. 1 porcin de maz. [place]. 1 porcin de fresas. Multiplique cada nmero de porciones por 15 g: 2 porciones de arroz x 15 g = 30 g. 1 porcin de maz x 15 g = 15 g. 1 porcin de leche x 15 g = 15 g. 1 porcin de fresas x 15 g = 15 g. Sume todas las cantidades para conocer el total de gramos de carbohidratos consumidos: 30 g + 15 g + 15 g + 15 g = 75 g de carbohidratos en total. Consejos para seguir este plan Al ir de compras [place] un plan de comidas y luego haga una lista de compras. Compre verduras frescas y congeladas, frutas frescas y congeladas, productos lcteos, huevos, frijoles, lentejas y cereales integrales. Fjese en las etiquetas de los alimentos. Elija los alimentos que tengan ms fibra y Surveyor, minerals. Evite los alimentos procesados y los alimentos con Nurse, learning disability. Planificacin de las comidas Trate de consumir la misma cantidad de gramos de carbohidratos en cada comida y en cada colacin. Planifique tomar comidas y colaciones regulares y equilibradas. Dnde buscar ms informacin American Diabetes Association (Asociacin Estadounidense de la Diabetes): diabetes.org Centers for Disease Control and Prevention (Centros para el Control y la Prevencin de [place]): StoreMirror.com.cy Academy of Nutrition and Dietetics (Academia de Nutricin y Information systems manager): Loss adjuster, chartered.org Association of Diabetes Care & Education Specialists (Asociacin de Especialistas en  Atencin y Educacin sobre la Diabetes): diabeteseducator.org Resumen El recuento de carbohidratos es un mtodo para llevar un registro de la cantidad de carbohidratos que se ingieren. La ingesta de carbohidratos aumenta la cantidad de azcar (glucosa) en la sangre. El recuento de la cantidad de carbohidratos que ingiere mejora el control de su glucemia. Esto le ayuda a Chief Technology Officer su diabetes. Un nutricionista puede ayudarlo a crear un plan de alimentacin y a calcular la cantidad de carbohidratos que debe ingerir en cada comida y colacin. Esta informacin no tiene Marine scientist el consejo del mdico. Asegrese de hacerle al mdico cualquier pregunta que tenga. Document Revised: 10/29/2019 Document Reviewed: 10/29/2019 Elsevier Patient Education  [place].

## 2021-07-29 NOTE — Progress Notes (Signed)
Established Patient Office Visit  Subjective   Patient ID: Patrick Wright, male    DOB: 07-24-1980  Age: 41 y.o. MRN: [id number]  Chief Complaint  Patient presents with   Follow-up    Talk about diabetes and stomach pain    HPI Patrick Wright is a 41 y/o male who has history of Type 2 diabetes mellitus, hyperlipidemia,  presents for visit. He has not been seen at the clinic for 21/2 years. He was seen at the ED on 07/07/21 for abdominal and diarrhea. His HgbA1c done during visit was 14% and his blood glucose reading was HI. He denies hypoglycemia, but endorses hyperglycemic symptoms. He states that he's compliant with his medication, and has not checked his blood glucose in many months. Overall, he states that he will work hard to control his blood glucose.  Review of Systems  Constitutional: Negative.   Eyes: Negative.   Respiratory: Negative.    Cardiovascular: Negative.   Skin: Negative.   Neurological: Negative.   Endo/Heme/Allergies:  Positive for polydipsia.  Psychiatric/Behavioral: Negative.        Objective:     BP 105/72 (BP Location: Left Arm, Patient Position: Sitting, Cuff Size: Small)   Pulse 90   Temp 98 F (36.7 C) (Oral)   Resp 16   Ht 5' 1"  (1.549 m)   Wt 112 lb (50.8 kg)   SpO2 98%   BMI 21.16 kg/m  BP Readings from Last 3 Encounters:  07/29/21 105/72  07/07/21 116/87  11/12/20 106/75   Wt Readings from Last 3 Encounters:  07/29/21 112 lb (50.8 kg)  07/07/21 120 lb (54.4 kg)  11/12/20 119 lb 0.8 oz (54 kg)      Physical Exam HENT:     Mouth/Throat:     Mouth: Mucous membranes are moist.  Eyes:     Extraocular Movements: Extraocular movements intact.     Conjunctiva/sclera: Conjunctivae normal.     Pupils: Pupils are equal, round, and reactive to light.  Cardiovascular:     Rate and Rhythm: Normal rate and regular rhythm.     Pulses: Normal pulses.     Heart sounds: Normal heart sounds.  Pulmonary:     Effort: Pulmonary  effort is normal.     Breath sounds: Normal breath sounds.  Skin:    General: Skin is warm.  Neurological:     General: No focal deficit present.     Mental Status: He is alert and oriented to person, place, and time. Mental status is at baseline.  Psychiatric:        Mood and Affect: Mood normal.        Behavior: Behavior normal.        Thought Content: Thought content normal.        Judgment: Judgment normal.      Results for orders placed or performed in visit on 07/29/21  POCT HgB A1C  Result Value Ref Range   Hemoglobin A1C 14.0 (A) 4.0 - 5.6 %   HbA1c POC (<> result, manual entry)     HbA1c, POC (prediabetic range)     HbA1c, POC (controlled diabetic range)      Last CBC Lab Results  Component Value Date   WBC 11.4 (H) 07/07/2021   HGB 13.8 07/07/2021   HCT 38.2 (L) 07/07/2021   MCV 89.7 07/07/2021   MCH 32.4 07/07/2021   RDW 12.1 07/07/2021   PLT 301 88/89/1694   Last metabolic panel Lab Results  Component Value  Date   GLUCOSE 542 (HH) 07/07/2021   NA 132 (L) 07/07/2021   K 3.4 (L) 07/07/2021   CL 97 (L) 07/07/2021   CO2 24 07/07/2021   BUN 16 07/07/2021   CREATININE 0.72 07/07/2021   GFRNONAA >60 07/07/2021   CALCIUM 9.3 07/07/2021   PROT 7.3 07/07/2021   ALBUMIN 4.1 07/07/2021   LABGLOB 2.6 08/30/2018   AGRATIO 1.9 08/30/2018   BILITOT 0.5 07/07/2021   ALKPHOS 112 07/07/2021   AST 28 07/07/2021   ALT 35 07/07/2021   ANIONGAP 11 07/07/2021   Last lipids Lab Results  Component Value Date   CHOL 203 (H) 03/28/2019   HDL 40 03/28/2019   LDLCALC 131 (H) 03/28/2019   TRIG 354 (H) 01/14/2020   CHOLHDL 5.1 (H) 03/28/2019   Last hemoglobin A1c Lab Results  Component Value Date   HGBA1C 14.0 (A) 07/29/2021   Last thyroid functions Lab Results  Component Value Date   TSH 1.770 04/03/2018      The 10-year ASCVD risk score (Arnett DK, et al., 2019) is: 2.7%    Assessment & Plan:   1. Diabetes mellitus due to underlying condition,  uncontrolled, with hyperglycemia ([place]) - His diabetes is uncontrolled, his HgbA1c was 14%, his goal should be less than 7%.  His blood glucose was HI, and he declined going to the ED. He was strongly advised to go to the ED if it continues to read HI.He was provided with glucometer, lancets and test stripes educated on how to perform finger stick and administer insulin, he return demonstration. He was advised to check his blood glucose bid, record and bring log to follow up appointment.He was advised to continue on low carb/non concentrated sweet diet. He was started on Glargine 13 untis at bedtime and 5 mg glipizide bid, educated him on medication side effects and advised to notify clinic. - POCT HgB A1C; Future - POCT Glucose (CBG); Future - POCT Glucose (CBG) - POCT HgB A1C - Insulin Glargine (BASAGLAR KWIKPEN) 100 UNIT/ML; Inject 13 Units into the skin at bedtime.  Dispense: 3 mL; Refill: 3 - glipiZIDE (GLUCOTROL) 5 MG tablet; Take 1 tablet (5 mg total) by mouth 2 (two) times daily before a meal.  Dispense: 30 tablet; Refill: 0    Return in about 5 days (around 08/03/2021), or if symptoms worsen or fail to improve.    Sotirios Navarro Jerold Coombe, NP

## 2021-07-30 ENCOUNTER — Other Ambulatory Visit: Payer: Self-pay

## 2021-08-03 ENCOUNTER — Other Ambulatory Visit: Payer: Self-pay

## 2021-08-03 ENCOUNTER — Encounter: Payer: Self-pay | Admitting: Gerontology

## 2021-08-03 ENCOUNTER — Ambulatory Visit: Payer: Self-pay | Admitting: Gerontology

## 2021-08-03 VITALS — BP 103/71 | HR 88 | Temp 97.8°F | Resp 16 | Ht 62.5 in | Wt 113.9 lb

## 2021-08-03 DIAGNOSIS — E0865 Diabetes mellitus due to underlying condition with hyperglycemia: Secondary | ICD-10-CM

## 2021-08-03 DIAGNOSIS — Z0000 Encounter for general adult medical examination without abnormal findings: Secondary | ICD-10-CM

## 2021-08-03 LAB — GLUCOSE, POCT (MANUAL RESULT ENTRY): POC Glucose: 280 mg/dl — AB (ref 70–99)

## 2021-08-03 MED ORDER — GLIPIZIDE 5 MG PO TABS
5.0000 mg | ORAL_TABLET | Freq: Two times a day (BID) | ORAL | 2 refills | Status: DC
  Filled 2021-08-03 – 2021-08-20 (×2): qty 30, 15d supply, fill #0
  Filled 2021-11-25: qty 30, 15d supply, fill #1

## 2021-08-03 NOTE — Progress Notes (Unsigned)
   Established Patient Office Visit  Subjective   Patient ID: Patrick Wright, male    DOB: 07-Nov-1980  Age: 41 y.o. MRN: [id number]  Chief Complaint  Patient presents with   Follow-up   Diabetes    HPI  Patrick Wright is a 41 y/o male who has history of Type 2 diabetes mellitus, hyperlipidemia,  presents for visit. His HgbA1c was 14%, he was started on 13 units of Glargin and 5 mg glipizide bid. He checks his bl0ood glucose bid, he states his fasting was 269 mg/dl, he states that his diahhrea has improved 90   Review of Systems  Constitutional: Negative.   Eyes: Negative.   Cardiovascular: Negative.   Neurological: Negative.   Endo/Heme/Allergies: Negative.   Psychiatric/Behavioral: Negative.        Objective:     BP 103/71 (BP Location: Left Arm, Patient Position: Sitting, Cuff Size: Normal)   Pulse 88   Temp 97.8 F (36.6 C) (Oral)   Resp 16   Ht 5' 2.5" (1.588 m)   Wt 113 lb 14.4 oz (51.7 kg)   SpO2 98%   BMI 20.50 kg/m  {Vitals History (Optional):23777}  Physical Exam   No results found for any visits on 08/03/21.  {Labs (Optional):23779}  The 10-year ASCVD risk score (Arnett DK, et al., 2019) is: 2.6%    Assessment & Plan:   Problem List Items Addressed This Visit   None   No follow-ups on file.    Patrcia Schnepp Jerold Coombe, NP

## 2021-08-03 NOTE — Progress Notes (Unsigned)
Patient's visit completed today with the help of [employer] interpreter services, id# [phone]

## 2021-08-03 NOTE — Patient Instructions (Signed)
Recuento de carbohidratos para la diabetes mellitus en los adultos Carbohydrate Counting for Diabetes Mellitus, Adult El recuento de carbohidratos es un mtodo para llevar un registro de la cantidad de carbohidratos que se ingieren. La ingesta de carbohidratos aumenta la cantidad de azcar (glucosa) en la sangre. El recuento de la cantidad de carbohidratos que ingiere mejora el control de su glucemia. Esto, a su vez, le ayuda a controlar su diabetes. Los carbohidratos se miden en gramos (g) por porcin. Es importante saber la cantidad de carbohidratos (en gramos o por tamao de porcin) que se puede ingerir en cada comida. Esto es Psychologist, forensic. Un nutricionista puede ayudarlo a crear un plan de alimentacin y a calcular la cantidad de carbohidratos que debe ingerir en cada comida y colacin. Qu alimentos contienen carbohidratos? Los siguientes alimentos incluyen carbohidratos: Granos, como panes y cereales. Frijoles secos y productos con soja. Verduras con almidn, como papas, guisantes y maz. Lambert Mody y jugos de frutas. Leche y Estate agent. Dulces y colaciones, como pasteles, galletas, caramelos, papas fritas de bolsa y refrescos. Cmo se calculan los carbohidratos de los alimentos? Hay dos maneras de calcular los carbohidratos de los alimentos. Puede leer las etiquetas de los alimentos o aprender cules son los tamaos de las porciones estndar de los alimentos. Puede usar cualquiera de estos mtodos o una combinacin de [place]. Usar la Scientific laboratory technician de informacin nutricional La lista de Informacin nutricional est incluida en las etiquetas de casi todas las bebidas y los alimentos envasados de los [place]. Esto incluye lo siguiente: El tamao de la porcin. Informacin sobre los nutrientes de cada porcin, incluidos los gramos de carbohidratos por porcin. Para usar la Informacin nutricional, decida cuntas porciones tomar. Luego, multiplique el nmero de porciones por la cantidad  de carbohidratos por porcin. El nmero resultante es la cantidad total de carbohidratos que comer. Conocer los tamaos de las porciones estndar de los alimentos Cuando coma alimentos que contengan carbohidratos y que no estn envasados o no incluyan la informacin nutricional en la etiqueta, debe medir las porciones para poder calcular los gramos de carbohidratos. Mida los alimentos que comer con una balanza de alimentos o una taza medidora, si es necesario. Decida cuntas porciones de International aid/development worker. Multiplique el nmero de porciones por 15. En los alimentos que contienen carbohidratos, una porcin [place] a 15 g de carbohidratos. Por ejemplo, si come 2 tazas o 10 onzas (300 g) de fresas, habr comido 2 porciones y 30 g de carbohidratos (2 porciones x 15 g = 30 g). En el caso de las comidas que contienen mezclas de ms de un alimento, como las sopas y los guisos, debe calcular los carbohidratos de cada alimento que se incluye. La siguiente lista contiene los tamaos de porciones estndar de los alimentos ricos en carbohidratos ms comunes. Cada una de estas porciones tiene aproximadamente 15 g de carbohidratos: 1 rebanada de pan. 1 tortilla de seis pulgadas (15 cm). ? de taza o 2 onzas (53 g) de arroz o pasta cocidos.  taza o 3 onzas (85 g) de lentejas o frijoles cocidos o enlatados, escurridos y enjuagados.  taza o 3 onzas (85 g) de verduras con almidn, como guisantes, maz o zapallo.  taza o 4 onzas (120 g) de cereal caliente.  taza o 3 onzas (85 g) de papas hervidas o en pur, o  o 3 onzas (85 g) de una papa grande al horno.  taza o 4 onzas fluidas (118 ml) de jugo de frutas. 1 taza u  8 onzas fluidas (237 ml) de leche. 1 unidad pequea o 4 onzas (106 g) de manzana.  unidad o 2 onzas (63 g) de una banana mediana. 1 taza o 5 oz (150 g) de fresas. 3 tazas o 1 oz (28.3 g) de palomitas de maz. Cul sera un ejemplo de recuento de carbohidratos? Para calcular los gramos  de carbohidratos de este ejemplo de comida, siga los pasos que se describen a continuacin. Ejemplo de comida 3 onzas (85 g) de pechugas de pollo. ? de taza o 4 onzas (106 g) de arroz integral.  taza o 3 onzas (85 g) de maz. 1 taza u 8 onzas fluidas (237 ml) de leche. 1 taza o 5 onzas (150 g) de fresas con crema batida sin azcar. Clculo de carbohidratos Identifique los alimentos que contienen carbohidratos: Arroz. Maz. Leche. Hughie Closs. Calcule cuntas porciones come de cada alimento: 2 porciones de arroz. 1 porcin de maz. [place]. 1 porcin de fresas. Multiplique cada nmero de porciones por 15 g: 2 porciones de arroz x 15 g = 30 g. 1 porcin de maz x 15 g = 15 g. 1 porcin de leche x 15 g = 15 g. 1 porcin de fresas x 15 g = 15 g. Sume todas las cantidades para conocer el total de gramos de carbohidratos consumidos: 30 g + 15 g + 15 g + 15 g = 75 g de carbohidratos en total. Consejos para seguir este plan Al ir de compras [place] un plan de comidas y luego haga una lista de compras. Compre verduras frescas y congeladas, frutas frescas y congeladas, productos lcteos, huevos, frijoles, lentejas y cereales integrales. Fjese en las etiquetas de los alimentos. Elija los alimentos que tengan ms fibra y Surveyor, minerals. Evite los alimentos procesados y los alimentos con Nurse, learning disability. Planificacin de las comidas Trate de consumir la misma cantidad de gramos de carbohidratos en cada comida y en cada colacin. Planifique tomar comidas y colaciones regulares y equilibradas. Dnde buscar ms informacin American Diabetes Association (Asociacin Estadounidense de la Diabetes): diabetes.org Centers for Disease Control and Prevention (Centros para el Control y la Prevencin de [place]): StoreMirror.com.cy Academy of Nutrition and Dietetics (Academia de Nutricin y Information systems manager): Loss adjuster, chartered.org Association of Diabetes Care & Education Specialists (Asociacin de Especialistas en  Atencin y Educacin sobre la Diabetes): diabeteseducator.org Resumen El recuento de carbohidratos es un mtodo para llevar un registro de la cantidad de carbohidratos que se ingieren. La ingesta de carbohidratos aumenta la cantidad de azcar (glucosa) en la sangre. El recuento de la cantidad de carbohidratos que ingiere mejora el control de su glucemia. Esto le ayuda a Chief Technology Officer su diabetes. Un nutricionista puede ayudarlo a crear un plan de alimentacin y a calcular la cantidad de carbohidratos que debe ingerir en cada comida y colacin. Esta informacin no tiene Marine scientist el consejo del mdico. Asegrese de hacerle al mdico cualquier pregunta que tenga. Document Revised: 10/29/2019 Document Reviewed: 10/29/2019 Elsevier Patient Education  [place].

## 2021-08-20 ENCOUNTER — Other Ambulatory Visit: Payer: Self-pay

## 2021-08-20 MED ORDER — UNIFINE PENTIPS 32G X 4 MM MISC
0 refills | Status: DC
  Filled 2021-08-20: qty 100, 100d supply, fill #0

## 2021-08-23 ENCOUNTER — Other Ambulatory Visit: Payer: Self-pay

## 2021-10-11 ENCOUNTER — Other Ambulatory Visit: Payer: Self-pay

## 2021-10-11 ENCOUNTER — Other Ambulatory Visit: Payer: Self-pay | Admitting: Gerontology

## 2021-10-11 DIAGNOSIS — E0865 Diabetes mellitus due to underlying condition with hyperglycemia: Secondary | ICD-10-CM

## 2021-10-12 ENCOUNTER — Other Ambulatory Visit: Payer: Self-pay

## 2021-10-12 MED FILL — Insulin Glargine Soln Pen-Injector 100 Unit/ML: SUBCUTANEOUS | 23 days supply | Qty: 3 | Fill #0 | Status: CN

## 2021-10-15 ENCOUNTER — Other Ambulatory Visit: Payer: Self-pay

## 2021-10-18 ENCOUNTER — Other Ambulatory Visit: Payer: Self-pay | Admitting: Gerontology

## 2021-10-18 ENCOUNTER — Other Ambulatory Visit: Payer: Self-pay

## 2021-10-18 DIAGNOSIS — E0865 Diabetes mellitus due to underlying condition with hyperglycemia: Secondary | ICD-10-CM

## 2021-10-19 ENCOUNTER — Other Ambulatory Visit: Payer: Self-pay

## 2021-10-27 ENCOUNTER — Other Ambulatory Visit: Payer: Self-pay

## 2021-10-27 DIAGNOSIS — Z0000 Encounter for general adult medical examination without abnormal findings: Secondary | ICD-10-CM

## 2021-10-27 DIAGNOSIS — E0865 Diabetes mellitus due to underlying condition with hyperglycemia: Secondary | ICD-10-CM

## 2021-10-29 LAB — COMPREHENSIVE METABOLIC PANEL
ALT: 24 IU/L (ref 0–44)
AST: 19 IU/L (ref 0–40)
Albumin/Globulin Ratio: 1.7 (ref 1.2–2.2)
Albumin: 5.2 g/dL — ABNORMAL HIGH (ref 4.1–5.1)
Alkaline Phosphatase: 124 IU/L — ABNORMAL HIGH (ref 44–121)
BUN/Creatinine Ratio: 25 — ABNORMAL HIGH (ref 9–20)
BUN: 21 mg/dL (ref 6–24)
Bilirubin Total: 0.8 mg/dL (ref 0.0–1.2)
CO2: 22 mmol/L (ref 20–29)
Calcium: 10.4 mg/dL — ABNORMAL HIGH (ref 8.7–10.2)
Chloride: 99 mmol/L (ref 96–106)
Creatinine, Ser: 0.84 mg/dL (ref 0.76–1.27)
Globulin, Total: 3 g/dL (ref 1.5–4.5)
Glucose: 211 mg/dL — ABNORMAL HIGH (ref 70–99)
Potassium: 4.3 mmol/L (ref 3.5–5.2)
Sodium: 139 mmol/L (ref 134–144)
Total Protein: 8.2 g/dL (ref 6.0–8.5)
eGFR: 111 mL/min/{1.73_m2} (ref >59)

## 2021-10-29 LAB — LIPID PANEL
Chol/HDL Ratio: 2.7 ratio (ref 0.0–5.0)
Cholesterol, Total: 147 mg/dL (ref 100–199)
HDL: 55 mg/dL (ref >39)
LDL Chol Calc (NIH): 72 mg/dL (ref 0–99)
Triglycerides: 110 mg/dL (ref 0–149)
VLDL Cholesterol Cal: 20 mg/dL (ref 5–40)

## 2021-10-29 LAB — HEMOGLOBIN A1C
Est. average glucose Bld gHb Est-mCnc: 232 mg/dL
Hgb A1c MFr Bld: 9.7 % — ABNORMAL HIGH (ref 4.8–5.6)

## 2021-10-29 LAB — MICROALBUMIN / CREATININE URINE RATIO
Creatinine, Urine: 346.5 mg/dL
Microalb/Creat Ratio: 35 mg/g creat — ABNORMAL HIGH (ref 0–29)
Microalbumin, Urine: 121.8 ug/mL

## 2021-11-03 ENCOUNTER — Ambulatory Visit: Payer: Self-pay | Admitting: Gerontology

## 2021-11-03 ENCOUNTER — Encounter: Payer: Self-pay | Admitting: Gerontology

## 2021-11-03 ENCOUNTER — Other Ambulatory Visit: Payer: Self-pay

## 2021-11-03 VITALS — BP 126/85 | HR 87 | Temp 97.5°F | Resp 16 | Ht 62.5 in | Wt 121.8 lb

## 2021-11-03 DIAGNOSIS — M546 Pain in thoracic spine: Secondary | ICD-10-CM

## 2021-11-03 DIAGNOSIS — E0865 Diabetes mellitus due to underlying condition with hyperglycemia: Secondary | ICD-10-CM

## 2021-11-03 DIAGNOSIS — M549 Dorsalgia, unspecified: Secondary | ICD-10-CM | POA: Insufficient documentation

## 2021-11-03 LAB — GLUCOSE, POCT (MANUAL RESULT ENTRY): POC Glucose: 189 mg/dl — AB (ref 70–99)

## 2021-11-03 MED ORDER — UNIFINE PENTIPS 32G X 4 MM MISC
0 refills | Status: DC
  Filled 2021-11-03: qty 100, 90d supply, fill #0

## 2021-11-03 NOTE — Progress Notes (Signed)
[place] interpreter services, id # [id number] used for today's visit.

## 2021-11-03 NOTE — Patient Instructions (Signed)
Recuento de carbohidratos para la diabetes mellitus en los adultos Carbohydrate Counting for Diabetes Mellitus, Adult El recuento de carbohidratos es un mtodo para llevar un registro de la cantidad de carbohidratos que se ingieren. La ingesta de carbohidratos aumenta la cantidad de azcar (glucosa) en la sangre. El recuento de la cantidad de carbohidratos que ingiere mejora el control de su glucemia. Esto, a su vez, le ayuda a controlar su diabetes. Los carbohidratos se miden en gramos (g) por porcin. Es importante saber la cantidad de carbohidratos (en gramos o por tamao de porcin) que se puede ingerir en cada comida. Esto es Psychologist, forensic. Un nutricionista puede ayudarlo a crear un plan de alimentacin y a calcular la cantidad de carbohidratos que debe ingerir en cada comida y colacin. Qu alimentos contienen carbohidratos? Los siguientes alimentos incluyen carbohidratos: Granos, como panes y cereales. Frijoles secos y productos con soja. Verduras con almidn, como papas, guisantes y maz. Lambert Mody y jugos de frutas. Leche y Estate agent. Dulces y colaciones, como pasteles, galletas, caramelos, papas fritas de bolsa y refrescos. Cmo se calculan los carbohidratos de los alimentos? Hay dos maneras de calcular los carbohidratos de los alimentos. Puede leer las etiquetas de los alimentos o aprender cules son los tamaos de las porciones estndar de los alimentos. Puede usar cualquiera de estos mtodos o una combinacin de [place]. Usar la Scientific laboratory technician de informacin nutricional La lista de Informacin nutricional est incluida en las etiquetas de casi todas las bebidas y los alimentos envasados de los [place]. Esto incluye lo siguiente: El tamao de la porcin. Informacin sobre los nutrientes de cada porcin, incluidos los gramos de carbohidratos por porcin. Para usar la Informacin nutricional, decida cuntas porciones tomar. Luego, multiplique el nmero de porciones por la cantidad  de carbohidratos por porcin. El nmero resultante es la cantidad total de carbohidratos que comer. Conocer los tamaos de las porciones estndar de los alimentos Cuando coma alimentos que contengan carbohidratos y que no estn envasados o no incluyan la informacin nutricional en la etiqueta, debe medir las porciones para poder calcular los gramos de carbohidratos. Mida los alimentos que comer con una balanza de alimentos o una taza medidora, si es necesario. Decida cuntas porciones de International aid/development worker. Multiplique el nmero de porciones por 15. En los alimentos que contienen carbohidratos, una porcin [place] a 15 g de carbohidratos. Por ejemplo, si come 2 tazas o 10 onzas (300 g) de fresas, habr comido 2 porciones y 30 g de carbohidratos (2 porciones x 15 g = 30 g). En el caso de las comidas que contienen mezclas de ms de un alimento, como las sopas y los guisos, debe calcular los carbohidratos de cada alimento que se incluye. La siguiente lista contiene los tamaos de porciones estndar de los alimentos ricos en carbohidratos ms comunes. Cada una de estas porciones tiene aproximadamente 15 g de carbohidratos: 1 rebanada de pan. 1 tortilla de seis pulgadas (15 cm). ? de taza o 2 onzas (53 g) de arroz o pasta cocidos.  taza o 3 onzas (85 g) de lentejas o frijoles cocidos o enlatados, escurridos y enjuagados.  taza o 3 onzas (85 g) de verduras con almidn, como guisantes, maz o zapallo.  taza o 4 onzas (120 g) de cereal caliente.  taza o 3 onzas (85 g) de papas hervidas o en pur, o  o 3 onzas (85 g) de una papa grande al horno.  taza o 4 onzas fluidas (118 ml) de jugo de frutas. 1 taza u  8 onzas fluidas (237 ml) de leche. 1 unidad pequea o 4 onzas (106 g) de manzana.  unidad o 2 onzas (63 g) de una banana mediana. 1 taza o 5 oz (150 g) de fresas. 3 tazas o 1 oz (28.3 g) de palomitas de maz. Cul sera un ejemplo de recuento de carbohidratos? Para calcular los gramos  de carbohidratos de este ejemplo de comida, siga los pasos que se describen a continuacin. Ejemplo de comida 3 onzas (85 g) de pechugas de pollo. ? de taza o 4 onzas (106 g) de arroz integral.  taza o 3 onzas (85 g) de maz. 1 taza u 8 onzas fluidas (237 ml) de leche. 1 taza o 5 onzas (150 g) de fresas con crema batida sin azcar. Clculo de carbohidratos Identifique los alimentos que contienen carbohidratos: Arroz. Maz. Leche. Hughie Closs. Calcule cuntas porciones come de cada alimento: 2 porciones de arroz. 1 porcin de maz. [place]. 1 porcin de fresas. Multiplique cada nmero de porciones por 15 g: 2 porciones de arroz x 15 g = 30 g. 1 porcin de maz x 15 g = 15 g. 1 porcin de leche x 15 g = 15 g. 1 porcin de fresas x 15 g = 15 g. Sume todas las cantidades para conocer el total de gramos de carbohidratos consumidos: 30 g + 15 g + 15 g + 15 g = 75 g de carbohidratos en total. Consejos para seguir este plan Al ir de compras [place] un plan de comidas y luego haga una lista de compras. Compre verduras frescas y congeladas, frutas frescas y congeladas, productos lcteos, huevos, frijoles, lentejas y cereales integrales. Fjese en las etiquetas de los alimentos. Elija los alimentos que tengan ms fibra y Surveyor, minerals. Evite los alimentos procesados y los alimentos con Nurse, learning disability. Planificacin de las comidas Trate de consumir la misma cantidad de gramos de carbohidratos en cada comida y en cada colacin. Planifique tomar comidas y colaciones regulares y equilibradas. Dnde buscar ms informacin American Diabetes Association (Asociacin Estadounidense de la Diabetes): diabetes.org Centers for Disease Control and Prevention (Centros para el Control y la Prevencin de [place]): StoreMirror.com.cy Academy of Nutrition and Dietetics (Academia de Nutricin y Information systems manager): Loss adjuster, chartered.org Association of Diabetes Care & Education Specialists (Asociacin de Especialistas en  Atencin y Educacin sobre la Diabetes): diabeteseducator.org Resumen El recuento de carbohidratos es un mtodo para llevar un registro de la cantidad de carbohidratos que se ingieren. La ingesta de carbohidratos aumenta la cantidad de azcar (glucosa) en la sangre. El recuento de la cantidad de carbohidratos que ingiere mejora el control de su glucemia. Esto le ayuda a Chief Technology Officer su diabetes. Un nutricionista puede ayudarlo a crear un plan de alimentacin y a calcular la cantidad de carbohidratos que debe ingerir en cada comida y colacin. Esta informacin no tiene Marine scientist el consejo del mdico. Asegrese de hacerle al mdico cualquier pregunta que tenga. Document Revised: 10/29/2019 Document Reviewed: 10/29/2019 Elsevier Patient Education  [place].

## 2021-11-03 NOTE — Progress Notes (Signed)
Established Patient Office Visit  Subjective   Patient ID: Patrick Wright, male    DOB: 19-Mar-1980  Age: 41 y.o. MRN: [id number]  Chief Complaint  Patient presents with   Follow-up    Labs drawn 10/27/21   Diabetes   Back Pain    Patient c/o left sided back pain x 5 days. Patient has been taking Advil liquid gels for pain. Patient also c/o generalized numbness on the left side of his body off & on x 2 weeks.Marland Kitchen     HPI  Patrick Wright is a 41 y/o male who has history of Type 2 diabetes mellitus, hyperlipidemia,  presents for visit. His HgbA1c done on 10/27/21 decreased from 14% to 9.7%. His Lipid panel was within normal limits. He states that he check his blood glucose  bid, and fasting reading was 300 mg/dl  this morning, but it was 189 mg/dl when checked during visit. He states that he's compliant with his medications, denies side effects and continues to make healthy lifestyle changes. He states that he takes garlic pills instead of Atorvastatin. He also c/o  intermittent, non traumatic non radiating left upper back sided pain that has been going on for 2 weeks. He describes pain as a pinching sensation 7/10 and taking Advil and rest relieve symptoms. He states that bending aggravate symptoms. He denies muscle nor motor weakness. Overall, he states that he's doing well and offers no further complaint.  Review of Systems  Constitutional: Negative.   Eyes: Negative.   Respiratory: Negative.    Cardiovascular: Negative.   Musculoskeletal:  Positive for back pain.  Neurological: Negative.   Endo/Heme/Allergies: Negative.       Objective:     BP 126/85 (BP Location: Left Arm, Patient Position: Sitting, Cuff Size: Normal)   Pulse 87   Temp (!) 97.5 F (36.4 C) (Oral)   Resp 16   Ht 5' 2.5" (1.588 m)   Wt 121 lb 12.8 oz (55.2 kg)   SpO2 98%   BMI 21.92 kg/m  BP Readings from Last 3 Encounters:  11/03/21 126/85  10/27/21 118/84  08/03/21 103/71   Wt Readings from  Last 3 Encounters:  11/03/21 121 lb 12.8 oz (55.2 kg)  10/27/21 118 lb 1.6 oz (53.6 kg)  08/03/21 113 lb 14.4 oz (51.7 kg)      Physical Exam HENT:     Head: Normocephalic and atraumatic.     Mouth/Throat:     Mouth: Mucous membranes are moist.  Eyes:     Extraocular Movements: Extraocular movements intact.     Conjunctiva/sclera: Conjunctivae normal.     Pupils: Pupils are equal, round, and reactive to light.  Cardiovascular:     Rate and Rhythm: Normal rate and regular rhythm.     Pulses: Normal pulses.     Heart sounds: Normal heart sounds.  Pulmonary:     Effort: Pulmonary effort is normal.     Breath sounds: Normal breath sounds.  Abdominal:     Tenderness: There is right CVA tenderness and left CVA tenderness.  Musculoskeletal:        General: Tenderness (to lower back with palpation.) present.  Skin:    General: Skin is warm.  Neurological:     General: No focal deficit present.     Mental Status: He is alert and oriented to person, place, and time. Mental status is at baseline.  Psychiatric:        Mood and Affect: Mood normal.  Behavior: Behavior normal.        Thought Content: Thought content normal.        Judgment: Judgment normal.      Results for orders placed or performed in visit on 11/03/21  POCT Glucose (CBG)  Result Value Ref Range   POC Glucose 189 (A) 70 - 99 mg/dl    Last CBC Lab Results  Component Value Date   WBC 11.4 (H) 07/07/2021   HGB 13.8 07/07/2021   HCT 38.2 (L) 07/07/2021   MCV 89.7 07/07/2021   MCH 32.4 07/07/2021   RDW 12.1 07/07/2021   PLT 301 39/76/7341   Last metabolic panel Lab Results  Component Value Date   GLUCOSE 211 (H) 10/27/2021   NA 139 10/27/2021   K 4.3 10/27/2021   CL 99 10/27/2021   CO2 22 10/27/2021   BUN 21 10/27/2021   CREATININE 0.84 10/27/2021   EGFR 111 10/27/2021   CALCIUM 10.4 (H) 10/27/2021   PROT 8.2 10/27/2021   ALBUMIN 5.2 (H) 10/27/2021   LABGLOB 3.0 10/27/2021   AGRATIO 1.7  10/27/2021   BILITOT 0.8 10/27/2021   ALKPHOS 124 (H) 10/27/2021   AST 19 10/27/2021   ALT 24 10/27/2021   ANIONGAP 11 07/07/2021   Last lipids Lab Results  Component Value Date   CHOL 147 10/27/2021   HDL 55 10/27/2021   LDLCALC 72 10/27/2021   TRIG 110 10/27/2021   CHOLHDL 2.7 10/27/2021   Last hemoglobin A1c Lab Results  Component Value Date   HGBA1C 9.7 (H) 10/27/2021   Last thyroid functions Lab Results  Component Value Date   TSH 1.770 04/03/2018      The 10-year ASCVD risk score (Arnett DK, et al., 2019) is: 4.4%    Assessment & Plan:   1. Diabetes mellitus due to underlying condition, uncontrolled, with hyperglycemia ([place]) -His HgbA1c was 9.7%, his diabetes is improving, he will continue on current medication, low carb/non concentrated sweet diet and exercise as tolerated. - Insulin Pen Needle (UNIFINE PENTIPS) 32G X 4 MM MISC; Use as directed  Dispense: 100 each; Refill: 0 - POCT Glucose (CBG); Future - POCT Glucose (CBG)  2. Acute left-sided thoracic back pain - Unknown etiology of back pain, will continue on 400 mg Ibuprofen every 12 hours as needed, will check urine to r/o UTI. He was advised to go to the ED for worsening symptoms. - UA/M w/rflx Culture, Routine; Future - UA/M w/rflx Culture, Routine   Return in about 3 months (around 01/27/2022), or if symptoms worsen or fail to improve.    Hilery Wintle Jerold Coombe, NP

## 2021-11-04 LAB — UA/M W/RFLX CULTURE, ROUTINE
Bilirubin, UA: NEGATIVE
Ketones, UA: NEGATIVE
Leukocytes,UA: NEGATIVE
Nitrite, UA: NEGATIVE
Protein,UA: NEGATIVE
RBC, UA: NEGATIVE
Specific Gravity, UA: >=1.03 — AB (ref 1.005–1.030)
Urobilinogen, Ur: 0.2 mg/dL (ref 0.2–1.0)
pH, UA: 6 (ref 5.0–7.5)

## 2021-11-04 LAB — MICROSCOPIC EXAMINATION
Bacteria, UA: NONE SEEN
Casts: NONE SEEN /lpf
Epithelial Cells (non renal): NONE SEEN /hpf (ref 0–10)
RBC, Urine: NONE SEEN /hpf (ref 0–2)
WBC, UA: NONE SEEN /hpf (ref 0–5)

## 2021-11-25 ENCOUNTER — Other Ambulatory Visit: Payer: Self-pay

## 2021-12-15 ENCOUNTER — Emergency Department: Payer: Self-pay

## 2021-12-15 ENCOUNTER — Observation Stay
Admission: EM | Admit: 2021-12-15 | Discharge: 2021-12-16 | Disposition: A | Discharge status: Home / Self Care | Payer: Self-pay | Attending: Internal Medicine | Admitting: Internal Medicine

## 2021-12-15 ENCOUNTER — Encounter: Payer: Self-pay | Admitting: Emergency Medicine

## 2021-12-15 ENCOUNTER — Other Ambulatory Visit: Payer: Self-pay

## 2021-12-15 DIAGNOSIS — R1013 Epigastric pain: Secondary | ICD-10-CM

## 2021-12-15 DIAGNOSIS — R6510 Systemic inflammatory response syndrome (SIRS) of non-infectious origin without acute organ dysfunction: Secondary | ICD-10-CM | POA: Insufficient documentation

## 2021-12-15 DIAGNOSIS — F1011 Alcohol abuse, in remission: Secondary | ICD-10-CM | POA: Insufficient documentation

## 2021-12-15 DIAGNOSIS — K921 Melena: Secondary | ICD-10-CM | POA: Insufficient documentation

## 2021-12-15 DIAGNOSIS — N170 Acute kidney failure with tubular necrosis: Secondary | ICD-10-CM | POA: Insufficient documentation

## 2021-12-15 DIAGNOSIS — F17210 Nicotine dependence, cigarettes, uncomplicated: Secondary | ICD-10-CM | POA: Insufficient documentation

## 2021-12-15 DIAGNOSIS — E119 Type 2 diabetes mellitus without complications: Secondary | ICD-10-CM | POA: Insufficient documentation

## 2021-12-15 DIAGNOSIS — I959 Hypotension, unspecified: Secondary | ICD-10-CM | POA: Insufficient documentation

## 2021-12-15 DIAGNOSIS — R109 Unspecified abdominal pain: Secondary | ICD-10-CM | POA: Diagnosis present

## 2021-12-15 DIAGNOSIS — K920 Hematemesis: Principal | ICD-10-CM | POA: Insufficient documentation

## 2021-12-15 DIAGNOSIS — A419 Sepsis, unspecified organism: Secondary | ICD-10-CM | POA: Diagnosis present

## 2021-12-15 DIAGNOSIS — R1012 Left upper quadrant pain: Secondary | ICD-10-CM

## 2021-12-15 DIAGNOSIS — E0865 Diabetes mellitus due to underlying condition with hyperglycemia: Secondary | ICD-10-CM

## 2021-12-15 DIAGNOSIS — K5730 Diverticulosis of large intestine without perforation or abscess without bleeding: Secondary | ICD-10-CM | POA: Insufficient documentation

## 2021-12-15 DIAGNOSIS — R197 Diarrhea, unspecified: Secondary | ICD-10-CM

## 2021-12-15 DIAGNOSIS — F1290 Cannabis use, unspecified, uncomplicated: Secondary | ICD-10-CM | POA: Insufficient documentation

## 2021-12-15 DIAGNOSIS — K7010 Alcoholic hepatitis without ascites: Secondary | ICD-10-CM | POA: Insufficient documentation

## 2021-12-15 DIAGNOSIS — Z794 Long term (current) use of insulin: Secondary | ICD-10-CM | POA: Insufficient documentation

## 2021-12-15 DIAGNOSIS — F1091 Alcohol use, unspecified, in remission: Secondary | ICD-10-CM

## 2021-12-15 DIAGNOSIS — Z7984 Long term (current) use of oral hypoglycemic drugs: Secondary | ICD-10-CM | POA: Insufficient documentation

## 2021-12-15 DIAGNOSIS — N179 Acute kidney failure, unspecified: Secondary | ICD-10-CM

## 2021-12-15 DIAGNOSIS — Z1152 Encounter for screening for COVID-19: Secondary | ICD-10-CM | POA: Insufficient documentation

## 2021-12-15 DIAGNOSIS — R112 Nausea with vomiting, unspecified: Secondary | ICD-10-CM

## 2021-12-15 LAB — URINALYSIS, ROUTINE W REFLEX MICROSCOPIC
Bacteria, UA: NONE SEEN
Bilirubin Urine: NEGATIVE
Glucose, UA: >=500 mg/dL — AB
Hgb urine dipstick: NEGATIVE
Ketones, ur: 5 mg/dL — AB
Leukocytes,Ua: NEGATIVE
Nitrite: NEGATIVE
Protein, ur: 100 mg/dL — AB
Specific Gravity, Urine: 1.021 (ref 1.005–1.030)
pH: 5 (ref 5.0–8.0)

## 2021-12-15 LAB — PROCALCITONIN: Procalcitonin: <0.1 ng/mL

## 2021-12-15 LAB — URINALYSIS, COMPLETE (UACMP) WITH MICROSCOPIC
Bilirubin Urine: NEGATIVE
Glucose, UA: >=500 mg/dL — AB
Hgb urine dipstick: NEGATIVE
Ketones, ur: NEGATIVE mg/dL
Leukocytes,Ua: NEGATIVE
Nitrite: NEGATIVE
Protein, ur: 100 mg/dL — AB
Specific Gravity, Urine: 1.022 (ref 1.005–1.030)
pH: 5 (ref 5.0–8.0)

## 2021-12-15 LAB — COMPREHENSIVE METABOLIC PANEL
ALT: 19 U/L (ref 0–44)
AST: 17 U/L (ref 15–41)
Albumin: 5.1 g/dL — ABNORMAL HIGH (ref 3.5–5.0)
Alkaline Phosphatase: 80 U/L (ref 38–126)
Anion gap: 11 (ref 5–15)
BUN: 43 mg/dL — ABNORMAL HIGH (ref 6–20)
CO2: 26 mmol/L (ref 22–32)
Calcium: 9.8 mg/dL (ref 8.9–10.3)
Chloride: 98 mmol/L (ref 98–111)
Creatinine, Ser: 2.03 mg/dL — ABNORMAL HIGH (ref 0.61–1.24)
GFR, Estimated: 41 mL/min — ABNORMAL LOW (ref >60)
Glucose, Bld: 181 mg/dL — ABNORMAL HIGH (ref 70–99)
Potassium: 3.6 mmol/L (ref 3.5–5.1)
Sodium: 135 mmol/L (ref 135–145)
Total Bilirubin: 1.4 mg/dL — ABNORMAL HIGH (ref 0.3–1.2)
Total Protein: 8.6 g/dL — ABNORMAL HIGH (ref 6.5–8.1)

## 2021-12-15 LAB — PROTIME-INR
INR: 1.1 (ref 0.8–1.2)
Prothrombin Time: 13.6 seconds (ref 11.4–15.2)

## 2021-12-15 LAB — CBC
HCT: 44.4 % (ref 39.0–52.0)
Hemoglobin: 15.9 g/dL (ref 13.0–17.0)
MCH: 31.2 pg (ref 26.0–34.0)
MCHC: 35.8 g/dL (ref 30.0–36.0)
MCV: 87.1 fL (ref 80.0–100.0)
Platelets: 341 10*3/uL (ref 150–400)
RBC: 5.1 MIL/uL (ref 4.22–5.81)
RDW: 11.9 % (ref 11.5–15.5)
WBC: 8 10*3/uL (ref 4.0–10.5)
nRBC: 0 % (ref 0.0–0.2)

## 2021-12-15 LAB — RESP PANEL BY RT-PCR (FLU A&B, COVID) ARPGX2
Influenza A by PCR: NEGATIVE
Influenza B by PCR: NEGATIVE
SARS Coronavirus 2 by RT PCR: NEGATIVE

## 2021-12-15 LAB — APTT: aPTT: 26 seconds (ref 24–36)

## 2021-12-15 LAB — LACTIC ACID, PLASMA
Lactic Acid, Venous: 1.4 mmol/L (ref 0.5–1.9)
Lactic Acid, Venous: 1.1 mmol/L (ref 0.5–1.9)

## 2021-12-15 LAB — LIPASE, BLOOD: Lipase: 39 U/L (ref 11–51)

## 2021-12-15 LAB — ETHANOL: Alcohol, Ethyl (B): <10 mg/dL (ref <10)

## 2021-12-15 MED ORDER — MELATONIN 5 MG PO TABS
5.0000 mg | ORAL_TABLET | Freq: Every day | ORAL | Status: DC
  Administered 2021-12-16: 5 mg via ORAL
  Filled 2021-12-15: qty 1

## 2021-12-15 MED ORDER — SODIUM CHLORIDE 0.9 % IV SOLN
2.0000 g | INTRAVENOUS | Status: DC
  Filled 2021-12-15: qty 20

## 2021-12-15 MED ORDER — LACTATED RINGERS IV SOLN: INTRAVENOUS | Status: DC

## 2021-12-15 MED ORDER — MORPHINE SULFATE (PF) 2 MG/ML IV SOLN: 2.0000 mg | INTRAVENOUS | Status: DC | PRN

## 2021-12-15 MED ORDER — METRONIDAZOLE 500 MG/100ML IV SOLN
500.0000 mg | Freq: Two times a day (BID) | INTRAVENOUS | Status: DC
  Administered 2021-12-16: 500 mg via INTRAVENOUS
  Filled 2021-12-15: qty 100

## 2021-12-15 MED ORDER — ONDANSETRON HCL 4 MG/2ML IJ SOLN
4.0000 mg | Freq: Four times a day (QID) | INTRAMUSCULAR | Status: DC | PRN
  Administered 2021-12-16: 4 mg via INTRAVENOUS
  Filled 2021-12-15: qty 2

## 2021-12-15 MED ORDER — ONDANSETRON HCL 4 MG PO TABS: 4.0000 mg | ORAL_TABLET | Freq: Four times a day (QID) | ORAL | Status: DC | PRN

## 2021-12-15 MED ORDER — INSULIN ASPART 100 UNIT/ML IJ SOLN: 0.0000 [IU] | Freq: Every day | INTRAMUSCULAR | Status: DC

## 2021-12-15 MED ORDER — PANTOPRAZOLE SODIUM 40 MG IV SOLR
40.0000 mg | INTRAVENOUS | Status: DC
  Administered 2021-12-15: 40 mg via INTRAVENOUS
  Filled 2021-12-15: qty 10

## 2021-12-15 MED ORDER — LACTATED RINGERS IV BOLUS (SEPSIS)
1000.0000 mL | Freq: Once | INTRAVENOUS | Status: AC
  Administered 2021-12-15: 1000 mL via INTRAVENOUS

## 2021-12-15 MED ORDER — IOHEXOL 300 MG/ML  SOLN
80.0000 mL | Freq: Once | INTRAMUSCULAR | Status: AC | PRN
  Administered 2021-12-15: 80 mL via INTRAVENOUS

## 2021-12-15 MED ORDER — ACETAMINOPHEN 325 MG PO TABS: 650.0000 mg | ORAL_TABLET | Freq: Four times a day (QID) | ORAL | Status: DC | PRN

## 2021-12-15 MED ORDER — INSULIN ASPART 100 UNIT/ML IJ SOLN
0.0000 [IU] | Freq: Three times a day (TID) | INTRAMUSCULAR | Status: DC
  Administered 2021-12-16: 2 [IU] via SUBCUTANEOUS
  Filled 2021-12-15: qty 1

## 2021-12-15 MED ORDER — METRONIDAZOLE 500 MG/100ML IV SOLN
500.0000 mg | Freq: Once | INTRAVENOUS | Status: AC
  Administered 2021-12-15: 500 mg via INTRAVENOUS
  Filled 2021-12-15: qty 100

## 2021-12-15 MED ORDER — SODIUM CHLORIDE 0.9 % IV BOLUS
1000.0000 mL | Freq: Once | INTRAVENOUS | Status: AC
  Administered 2021-12-15: 1000 mL via INTRAVENOUS

## 2021-12-15 MED ORDER — SODIUM CHLORIDE 0.9 % IV SOLN
2.0000 g | Freq: Once | INTRAVENOUS | Status: AC
  Administered 2021-12-15: 2 g via INTRAVENOUS
  Filled 2021-12-15: qty 20

## 2021-12-15 MED ORDER — ACETAMINOPHEN 650 MG RE SUPP: 650.0000 mg | Freq: Four times a day (QID) | RECTAL | Status: DC | PRN

## 2021-12-15 MED ORDER — HYDROCODONE-ACETAMINOPHEN 5-325 MG PO TABS: 1.0000 | ORAL_TABLET | ORAL | Status: DC | PRN

## 2021-12-15 MED ORDER — ENOXAPARIN SODIUM 40 MG/0.4ML IJ SOSY: 40.0000 mg | PREFILLED_SYRINGE | INTRAMUSCULAR | Status: DC

## 2021-12-15 NOTE — Assessment & Plan Note (Signed)
Sliding scale insulin coverage Follow A1c

## 2021-12-15 NOTE — ED Notes (Signed)
Pt is alert and oriented. Pt is ambulatory. Pt had N/V times 3 days. Pt was lying on ground outside when called again for vitals. Pt taken back and placed in hall for workup

## 2021-12-15 NOTE — ED Triage Notes (Signed)
AAOx3.  Skin warm and dry. Ambulates with easy and steady gait.  Patient was laying outside on sidewalk when called to recheck vital signs.  PIV started and 1l NS started.  Will continue to monitor.

## 2021-12-15 NOTE — Assessment & Plan Note (Deleted)
Sepsis criteria includes hypotension, tachycardia and suspected GI infection Continue fluid resuscitation Continue Rocephin and Flagyl for now Follow procalcitonin

## 2021-12-15 NOTE — ED Provider Notes (Signed)
[facility] Provider Note   Event Date/Time   First MD Initiated Contact with Patient 12/15/21 1712     (approximate) History  Abdominal Pain  HPI Patrick Wright is a 41 y.o. male with a past medical history of alcohol abuse and 2 month sober who presents for left-sided abdominal pain with associated vomiting over the past 3 days.  Patient states that he has been unable to control his vomiting over this time presented to the emergency department.  Patient denies any recent travel or sick contacts.  Patient denies any radiation of this abdominal pain.  Patient describes this pain as 7/10, left-sided, and nonradiating. ROS: Patient currently denies any vision changes, tinnitus, difficulty speaking, facial droop, sore throat, chest pain, shortness of breath, diarrhea, dysuria, or weakness/numbness/paresthesias in any extremity   Physical Exam  Triage Vital Signs: ED Triage Vitals  Enc Vitals Group     BP 12/15/21 1318 (!) 83/67     Pulse Rate 12/15/21 1318 (!) 110     Resp 12/15/21 1318 16     Temp 12/15/21 1318 97.8 F (36.6 C)     Temp Source 12/15/21 1533 Oral     SpO2 12/15/21 1318 99 %     Weight 12/15/21 1310 121 lb 11.1 oz (55.2 kg)     Height 12/15/21 1310 5' 2"  (1.575 m)     Head Circumference --      Peak Flow --      Pain Score 12/15/21 1310 7     Pain Loc --      Pain Edu? --      Excl. in [place]? --    Most recent vital signs: Vitals:   12/15/21 2051 12/15/21 2201  BP:  101/73  Pulse:  92  Resp:  16  Temp: 98.1 F (36.7 C)   SpO2:  100%   General: Awake, oriented x4. CV:  Good peripheral perfusion.  Resp:  Normal effort.  Abd:  No distention.  Mild left-sided tenderness to palpation Other:  Middle-aged Hispanic male laying in bed in no acute distress ED Results / Procedures / Treatments  Labs (all labs ordered are listed, but only abnormal results are displayed) Labs Reviewed  COMPREHENSIVE METABOLIC PANEL - Abnormal; Notable  for the following components:      Result Value   Glucose, Bld 181 (*)    BUN 43 (*)    Creatinine, Ser 2.03 (*)    Total Protein 8.6 (*)    Albumin 5.1 (*)    Total Bilirubin 1.4 (*)    GFR, Estimated 41 (*)    All other components within normal limits  URINALYSIS, ROUTINE W REFLEX MICROSCOPIC - Abnormal; Notable for the following components:   Color, Urine YELLOW (*)    APPearance HAZY (*)    Glucose, UA >=500 (*)    Ketones, ur 5 (*)    Protein, ur 100 (*)    All other components within normal limits  URINALYSIS, COMPLETE (UACMP) WITH MICROSCOPIC - Abnormal; Notable for the following components:   Color, Urine YELLOW (*)    APPearance HAZY (*)    Glucose, UA >=500 (*)    Protein, ur 100 (*)    Bacteria, UA MANY (*)    All other components within normal limits  RESP PANEL BY RT-PCR (FLU A&B, COVID) ARPGX2  CULTURE, BLOOD (ROUTINE X 2)  CULTURE, BLOOD (ROUTINE X 2)  URINE CULTURE  LIPASE, BLOOD  CBC  PROTIME-INR  LACTIC ACID, PLASMA  APTT  LACTIC ACID, PLASMA  HIV ANTIBODY (ROUTINE TESTING W REFLEX)  PROCALCITONIN  CBC  CREATININE, SERUM  COMPREHENSIVE METABOLIC PANEL  CBC  URINE DRUG SCREEN, QUALITATIVE ([place] ONLY)  ETHANOL   EKG ED ECG REPORT I, Naaman Plummer, the attending physician, personally viewed and interpreted this ECG. Date: 12/15/2021 EKG Time: 2105 Rate: 86 Rhythm: normal sinus rhythm QRS Axis: normal Intervals: Right bundle branch block ST/T Wave abnormalities: normal Narrative Interpretation: NSR with RBBB.  No evidence of acute ischemia RADIOLOGY ED MD interpretation: CT PE of the abdomen and pelvis with IV contrast interpreted by me shows no acute findings or explanation of patient's symptoms with distal colonic diverticulosis without evidence of recurrent acute inflammation  One-view portable chest x-ray interpreted by me shows no evidence of acute abnormalities including no pneumonia, pneumothorax, or widened mediastinum -Agree with  radiology assessment Official radiology report(s): DG Chest Port 1 View  Result Date: 12/15/2021 CLINICAL DATA:  Left lower quadrant abdominal pain and vomiting for 3 days EXAM: PORTABLE CHEST 1 VIEW COMPARISON:  11/12/2020 FINDINGS: Single frontal view of the chest demonstrates an unremarkable cardiac silhouette. No airspace disease, effusion, or pneumothorax. No acute bony abnormalities. IMPRESSION: 1. No acute intrathoracic process. Electronically Signed   By: Randa Ngo M.D.   On: 12/15/2021 19:33   CT Abdomen Pelvis W Contrast  Result Date: 12/15/2021 CLINICAL DATA:  Left upper and lower quadrant abdominal pain with vomiting for 3 days. Patient reports similar symptoms previously which have resolved. EXAM: CT ABDOMEN AND PELVIS WITH CONTRAST TECHNIQUE: Multidetector CT imaging of the abdomen and pelvis was performed using the standard protocol following bolus administration of intravenous contrast. RADIATION DOSE REDUCTION: This exam was performed according to the departmental dose-optimization program which includes automated exposure control, adjustment of the mA and/or kV according to patient size and/or use of iterative reconstruction technique. CONTRAST:  38m OMNIPAQUE IOHEXOL 300 MG/ML  SOLN COMPARISON:  Abdominopelvic CT 07/07/2021 and 01/14/2020. FINDINGS: Lower chest: Clear lung bases. No significant pleural or pericardial effusion. 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 Hepatobiliary: The liver is normal in density without suspicious focal abnormality. No evidence of gallstones, gallbladder wall thickening or biliary dilatation. Pancreas: Unremarkable. No pancreatic ductal dilatation or surrounding inflammatory changes. Spleen: Normal in size without focal abnormality. Adrenals/Urinary Tract: Both adrenal glands appear normal. No evidence of urinary tract calculus, suspicious renal lesion  or hydronephrosis. The bladder appears unremarkable for its degree of distention. Stomach/Bowel: No enteric contrast administered. The stomach appears unremarkable for its degree of distention. The small bowel, appendix and proximal colon appear normal, without distension, wall thickening or surrounding inflammation. There is descending and sigmoid diverticulosis without evidence of recurrent acute inflammation. No evidence of bowel obstruction or perforation. Vascular/Lymphatic: There are no enlarged abdominal or pelvic lymph nodes. No significant vascular findings. Reproductive: The prostate gland appears unremarkable. Other: Intact abdominal wall. No ascites, free air or focal extraluminal fluid collection. Musculoskeletal: No acute or significant osseous findings. Scattered incidental bone islands are unchanged. IMPRESSION: 1. No acute findings or explanation for the patient's symptoms. 2. Distal colonic diverticulosis without evidence of recurrent acute inflammation. 3. 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 Electronically Signed   By: WRichardean SaleM.D.   On: 12/15/2021 18:19   PROCEDURES: Critical Care performed: No .1-3 Lead EKG Interpretation  Performed by:  Naaman Plummer, MD Authorized by: Naaman Plummer, MD     Interpretation: normal     ECG rate:  91   ECG rate assessment: normal     Rhythm: sinus rhythm     Ectopy: none     Conduction: normal    MEDICATIONS ORDERED IN ED: Medications  lactated ringers infusion ( Intravenous New Bag/Given 12/15/21 2122)  pantoprazole (PROTONIX) injection 40 mg (has no administration in time range)  insulin aspart (novoLOG) injection 0-15 Units (has no administration in time range)  insulin aspart (novoLOG) injection 0-5 Units (has no administration in time range)  enoxaparin (LOVENOX) injection 40 mg (has no administration in time range)  lactated  ringers infusion (has no administration in time range)  cefTRIAXone (ROCEPHIN) 2 g in sodium chloride 0.9 % 100 mL IVPB (has no administration in time range)  metroNIDAZOLE (FLAGYL) IVPB 500 mg (has no administration in time range)  acetaminophen (TYLENOL) tablet 650 mg (has no administration in time range)    Or  acetaminophen (TYLENOL) suppository 650 mg (has no administration in time range)  HYDROcodone-acetaminophen (NORCO/VICODIN) 5-325 MG per tablet 1-2 tablet (has no administration in time range)  ondansetron (ZOFRAN) tablet 4 mg (has no administration in time range)    Or  ondansetron (ZOFRAN) injection 4 mg (has no administration in time range)  morphine (PF) 2 MG/ML injection 2 mg (has no administration in time range)  sodium chloride 0.9 % bolus 1,000 mL (0 mLs Intravenous Stopped 12/15/21 1628)  sodium chloride 0.9 % bolus 1,000 mL (0 mLs Intravenous Stopped 12/15/21 2052)  iohexol (OMNIPAQUE) 300 MG/ML solution 80 mL (80 mLs Intravenous Contrast Given 12/15/21 1756)  lactated ringers bolus 1,000 mL (0 mLs Intravenous Stopped 12/15/21 2006)  cefTRIAXone (ROCEPHIN) 2 g in sodium chloride 0.9 % 100 mL IVPB (0 g Intravenous Stopped 12/15/21 2052)  metroNIDAZOLE (FLAGYL) IVPB 500 mg (0 mg Intravenous Stopped 12/15/21 2102)   IMPRESSION / MDM / ASSESSMENT AND PLAN / ED COURSE  I reviewed the triage vital signs and the nursing notes.                             The patient is on the cardiac monitor to evaluate for evidence of arrhythmia and/or significant heart rate changes. Patient's presentation is most consistent with acute presentation with potential threat to life or bodily function. This patient presents with generalized weakness and fatigue likely secondary to dehydration. Suspect acute kidney injury of prerenal origin. Doubt intrinsic renal dysfunction or obstructive nephropathy. Considered alternate etiologies of the patients symptoms including infectious processes, severe  metabolic derangements or electrolyte abnormalities, ischemia/ACS, heart failure, and intracranial/central processes but think these are unlikely given the history and physical exam. Low suspicion for acute intra-abdominal pathology including appendicitis, biliary disease, diverticulitis, AAA rupture Plan: labs, 3L fluid resuscitation, pain/nausea control, reassessment  Dispo: Admit to medicine   FINAL CLINICAL IMPRESSION(S) / ED DIAGNOSES   Final diagnoses:  Left upper quadrant abdominal pain  Acute kidney injury ([place])  Diarrhea of presumed infectious origin   Rx / DC Orders   ED Discharge Orders     None      Note:  This document was prepared using Dragon voice recognition software and may include unintentional dictation errors.   Naaman Plummer, MD 12/15/21 [phone]

## 2021-12-15 NOTE — H&P (Signed)
History and Physical    Patient: Patrick Wright AJO:[id number] DOB: 1980/04/13 DOA: 12/15/2021 DOS: the patient was seen and examined on 12/15/2021 PCP: Pcp, No  Patient coming from: Home  Chief Complaint:  Chief Complaint  Patient presents with   Abdominal Pain    HPI: Patrick Wright is a 41 y.o. male with medical history significant for hypertension, , diabetes mellitus, alcoholic hepatitis, psoriasis, alcohol use disorder currently in remission for the past 2 months, who presents with a 3-day history of nausea and vomiting associated with left-sided abdominal pain.  He denies fever or chills, chest pain or shortness of breath.  He denies diarrhea or dysuria.  Patient was hospitalized in November 2021 with similar symptoms which will attributed to his alcohol use. ED course and data review: BP on arrival 83/67 with pulse 110 and otherwise normal vitals.  BP was fluid responsive to 97/72 with map of 70 following at 3 L IV fluid bolus in the ED Labs showed normal CBC with lactic acid 1.4.  Lipase and LFTs WNL except for slightly elevated bilirubin of 1.4.  Glucose 181, creatinine 2.03, up from baseline of 0.84.  Urinalysis sterile with over 500 glucose and 5 ketones.  COVID and flu negative EKG, personally reviewed and interpreted showing NSR at 84 with RBBB.  Chest x-ray nonacute and CT abdomen and pelvis showing the following: IMPRESSION: 1. No acute findings or explanation for the patient's symptoms. 2. Distal colonic diverticulosis without evidence of recurrent acute inflammation. 3. 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  Patient was treated with a 3 L LR bolus and started on Rocephin and metronidazole for possible sepsis of suspected intra-abdominal source.  Hospitalist consulted for admission.   Review of Systems: As mentioned in the history of present illness. All other  systems reviewed and are negative.  Past Medical History:  Diagnosis Date   Diabetes mellitus without complication ([place])    Hyperlipidemia    Hypertension    Pancreatitis    Psoriasis    Past Surgical History:  Procedure Laterality Date   NO PAST SURGERIES     Social History:  reports that he has been smoking cigarettes. He started smoking about 31 years ago. He has a 30.00 pack-year smoking history. He has never used smokeless tobacco. He reports current alcohol use of about 24.0 standard drinks of alcohol per week. He reports current drug use. Frequency: 3.00 times per week. Drug: Marijuana.  No Known Allergies  Family History  Problem Relation Age of Onset   Diabetes Mother    Alcohol abuse Father    Cirrhosis Father    Diabetes Sister    Diabetes Brother    Other Maternal Grandmother        unknown medical history   Other Maternal Grandfather        unknown medical history   Other Paternal Grandmother        unknown medical history   Other Paternal Grandfather        unknown medical history    Prior to Admission medications   Medication Sig Start Date End Date Taking? Authorizing Provider  atorvastatin (LIPITOR) 40 MG tablet Take 1 tablet (40 mg total) by mouth daily at 6 PM. Patient not taking: Reported on 07/29/2021 05/07/19   Iloabachie, Chioma E, NP  blood glucose meter kit and supplies KIT Dispense based on patient and insurance preference. Use up to four times  daily as directed. (FOR ICD-9 250.00, 250.01). Patient not taking: Reported on 12/15/2021 05/25/17   Vaughan Basta, MD  GARLIC PO Take 1 tablet by mouth daily. Patient not taking: Reported on 12/15/2021    [provider]  glipiZIDE (GLUCOTROL) 5 MG tablet Take 1 tablet (5 mg total) by mouth 2 (two) times daily before a meal. Patient not taking: Reported on 11/03/2021 08/03/21   Iloabachie, Chioma E, NP  Ibuprofen (ADVIL LIQUI-GELS MINIS) 200 MG CAPS Take 400 mg by mouth 2 (two) times daily as  needed. Patient not taking: Reported on 12/15/2021    [provider]  Insulin Glargine (BASAGLAR KWIKPEN) 100 UNIT/ML Inject 13 Units into the skin at bedtime. Patient not taking: Reported on 12/15/2021 10/12/21   Caryl Asp E, NP  Insulin Pen Needle (UNIFINE PENTIPS) 32G X 4 MM MISC Use as directed Patient not taking: Reported on 12/15/2021 11/03/21   Langston Reusing, NP    Physical Exam: Vitals:   12/15/21 1833 12/15/21 1950 12/15/21 2051 12/15/21 2201  BP: (!) 88/59 97/72  101/73  Pulse: 96 89  92  Resp:  16  16  Temp:   98.1 F (36.7 C)   TempSrc:   Oral   SpO2: 99% 100%  100%  Weight:      Height:       Physical Exam  Labs on Admission: I have personally reviewed following labs and imaging studies  CBC: Recent Labs  Lab 12/15/21 1311  WBC 8.0  HGB 15.9  HCT 44.4  MCV 87.1  PLT 774   Basic Metabolic Panel: Recent Labs  Lab 12/15/21 1311  NA 135  K 3.6  CL 98  CO2 26  GLUCOSE 181*  BUN 43*  CREATININE 2.03*  CALCIUM 9.8   GFR: Estimated Creatinine Clearance: 36.2 mL/min (A) (by C-G formula based on SCr of 2.03 mg/dL (H)). Liver Function Tests: Recent Labs  Lab 12/15/21 1311  AST 17  ALT 19  ALKPHOS 80  BILITOT 1.4*  PROT 8.6*  ALBUMIN 5.1*   Recent Labs  Lab 12/15/21 1311  LIPASE 39   No results for input(s): "AMMONIA" in the last 168 hours. Coagulation Profile: Recent Labs  Lab 12/15/21 1745  INR 1.1   Cardiac Enzymes: No results for input(s): "CKTOTAL", "CKMB", "CKMBINDEX", "TROPONINI" in the last 168 hours. BNP (last 3 results) No results for input(s): "PROBNP" in the last 8760 hours. HbA1C: No results for input(s): "HGBA1C" in the last 72 hours. CBG: No results for input(s): "GLUCAP" in the last 168 hours. Lipid Profile: No results for input(s): "CHOL", "HDL", "LDLCALC", "TRIG", "CHOLHDL", "LDLDIRECT" in the last 72 hours. Thyroid Function Tests: No results for input(s): "TSH", "T4TOTAL", "FREET4",  "T3FREE", "THYROIDAB" in the last 72 hours. Anemia Panel: No results for input(s): "VITAMINB12", "FOLATE", "FERRITIN", "TIBC", "IRON", "RETICCTPCT" in the last 72 hours. Urine analysis:    Component Value Date/Time   COLORURINE YELLOW (A) 12/15/2021 1840   APPEARANCEUR HAZY (A) 12/15/2021 1840   APPEARANCEUR Clear 11/03/2021 1137   LABSPEC 1.022 12/15/2021 1840   PHURINE 5.0 12/15/2021 1840   GLUCOSEU >=500 (A) 12/15/2021 1840   HGBUR NEGATIVE 12/15/2021 1840   BILIRUBINUR NEGATIVE 12/15/2021 1840   BILIRUBINUR Negative 11/03/2021 [place] 12/15/2021 1840   PROTEINUR 100 (A) 12/15/2021 1840   NITRITE NEGATIVE 12/15/2021 1840   LEUKOCYTESUR NEGATIVE 12/15/2021 1840    Radiological Exams on Admission: DG Chest Port 1 View  Result Date: 12/15/2021 CLINICAL DATA:  Left lower  quadrant abdominal pain and vomiting for 3 days EXAM: PORTABLE CHEST 1 VIEW COMPARISON:  11/12/2020 FINDINGS: Single frontal view of the chest demonstrates an unremarkable cardiac silhouette. No airspace disease, effusion, or pneumothorax. No acute bony abnormalities. IMPRESSION: 1. No acute intrathoracic process. Electronically Signed   By: Randa Ngo M.D.   On: 12/15/2021 19:33   CT Abdomen Pelvis W Contrast  Result Date: 12/15/2021 CLINICAL DATA:  Left upper and lower quadrant abdominal pain with vomiting for 3 days. Patient reports similar symptoms previously which have resolved. EXAM: CT ABDOMEN AND PELVIS WITH CONTRAST TECHNIQUE: Multidetector CT imaging of the abdomen and pelvis was performed using the standard protocol following bolus administration of intravenous contrast. RADIATION DOSE REDUCTION: This exam was performed according to the departmental dose-optimization program which includes automated exposure control, adjustment of the mA and/or kV according to patient size and/or use of iterative reconstruction technique. CONTRAST:  49m OMNIPAQUE IOHEXOL 300 MG/ML  SOLN COMPARISON:   Abdominopelvic CT 07/07/2021 and 01/14/2020. FINDINGS: Lower chest: Clear lung bases. No significant pleural or pericardial effusion. 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 Hepatobiliary: The liver is normal in density without suspicious focal abnormality. No evidence of gallstones, gallbladder wall thickening or biliary dilatation. Pancreas: Unremarkable. No pancreatic ductal dilatation or surrounding inflammatory changes. Spleen: Normal in size without focal abnormality. Adrenals/Urinary Tract: Both adrenal glands appear normal. No evidence of urinary tract calculus, suspicious renal lesion or hydronephrosis. The bladder appears unremarkable for its degree of distention. Stomach/Bowel: No enteric contrast administered. The stomach appears unremarkable for its degree of distention. The small bowel, appendix and proximal colon appear normal, without distension, wall thickening or surrounding inflammation. There is descending and sigmoid diverticulosis without evidence of recurrent acute inflammation. No evidence of bowel obstruction or perforation. Vascular/Lymphatic: There are no enlarged abdominal or pelvic lymph nodes. No significant vascular findings. Reproductive: The prostate gland appears unremarkable. Other: Intact abdominal wall. No ascites, free air or focal extraluminal fluid collection. Musculoskeletal: No acute or significant osseous findings. Scattered incidental bone islands are unchanged. IMPRESSION: 1. No acute findings or explanation for the patient's symptoms. 2. Distal colonic diverticulosis without evidence of recurrent acute inflammation. 3. 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 Electronically Signed   By: WRichardean SaleM.D.   On: 12/15/2021 18:19     Data Reviewed: Relevant notes from primary  care and specialist visits, past discharge summaries as available in EHR, including Care Everywhere. Prior diagnostic testing as pertinent to current admission diagnoses Updated medications and problem lists for reconciliation ED course, including vitals, labs, imaging, treatment and response to treatment Triage notes, nursing and pharmacy notes and ED provider's notes Notable results as noted in HPI   Assessment and Plan: * Severe sepsis (HCC) Sepsis criteria includes hypotension, tachycardia and suspected GI infection Continue fluid resuscitation Continue Rocephin and Flagyl for now Follow procalcitonin  Abdominal pain Intractable vomiting Suspect acute gastritis, possible early gastroenteritis Patient presents with 3 days of intractable nausea and vomiting and mostly left-sided abdominal pain without fever or diarrhea Hyperbilirubinemia with otherwise normal LFTs and normal lipase and COVID-negative CT abdomen and pelvis nonacute, urinalysis unremarkable and chest x-ray clear We will continue Rocephin and metronidazole for now We will add procalcitonin as well as UDS IV antiemetics, IV Protonix, IV pain meds and supportive care Clear liquid diet advance as tolerated  Continue aggressive fluid resuscitation Consider GI referral at discharge  Hypotension Secondary to intractable nausea and vomiting Improving with fluids Hold home antihypertensives  AKI (acute kidney injury) ([place]) Creatinine 2.03 above baseline of 0.84 Secondary to  ATN from volume depletion from intractable vomiting Continue fluid resuscitation and monitor renal function, avoid nephrotoxins  Alcohol use disorder in remission Patient has reportedly not taken a drink in 2 months   Diabetes mellitus without complication (HCC) Sliding scale insulin coverage Follow A1c        DVT prophylaxis: Lovenox  Consults: none  Advance Care Planning:   Code Status: Full Code   Family Communication:  none  Disposition Plan: Back to previous home environment  Severity of Illness: The appropriate patient status for this patient is OBSERVATION. Observation status is judged to be reasonable and necessary in order to provide the required intensity of service to ensure the patient's safety. The patient's presenting symptoms, physical exam findings, and initial radiographic and laboratory data in the context of their medical condition is felt to place them at decreased risk for further clinical deterioration. Furthermore, it is anticipated that the patient will be medically stable for discharge from the hospital within 2 midnights of admission.   Author: Athena Masse, MD 12/15/2021 10:07 PM  For on call review [email or web address].

## 2021-12-15 NOTE — Consult Note (Signed)
CODE SEPSIS - PHARMACY COMMUNICATION  **Broad Spectrum Antibiotics should be administered within 1 hour of Sepsis diagnosis**  Time Code Sepsis Called/Page Received: 8588  Antibiotics Ordered: ceftriaxone, metronidazole  Time of 1st antibiotic administration: 1946  Additional action taken by pharmacy: na  If necessary, Name of Provider/Nurse Contacted: na    Dorothe Pea, PharmD, BCPS Clinical Pharmacist   12/15/2021  7:53 PM

## 2021-12-15 NOTE — Assessment & Plan Note (Signed)
Secondary to intractable nausea and vomiting Improving with fluids Hold home antihypertensives

## 2021-12-15 NOTE — Assessment & Plan Note (Signed)
Patient has reportedly not taken a drink in 2 months

## 2021-12-15 NOTE — ED Triage Notes (Signed)
C/O LUQ abdominal pain and vomiting x 3 days.  Has had similar symptoms in the past, but symptoms usually resolve.  Patient with history of alcohol abuse, but has not had any alcohol x 2 months.

## 2021-12-15 NOTE — Sepsis Progress Note (Signed)
Elink monitoring for the code sepsis protocol.  

## 2021-12-15 NOTE — Assessment & Plan Note (Signed)
Creatinine 2.03 above baseline of 0.84 Secondary to  ATN from volume depletion from intractable vomiting Continue fluid resuscitation and monitor renal function, avoid nephrotoxins

## 2021-12-15 NOTE — ED Notes (Signed)
Pt transported to CT ?

## 2021-12-15 NOTE — Assessment & Plan Note (Addendum)
Intractable vomiting Suspect acute gastritis, possible early gastroenteritis Patient presents with 3 days of intractable nausea and vomiting and mostly left-sided abdominal pain without fever or diarrhea Hyperbilirubinemia with otherwise normal LFTs and normal lipase and COVID-negative CT abdomen and pelvis nonacute, urinalysis unremarkable and chest x-ray clear We will continue Rocephin and metronidazole for now We will add procalcitonin as well as UDS IV antiemetics, IV Protonix, IV pain meds and supportive care Clear liquid diet advance as tolerated Continue aggressive fluid resuscitation Consider GI referral at discharge

## 2021-12-16 ENCOUNTER — Encounter
Admission: EM | Disposition: A | Discharge status: Home / Self Care | Payer: Self-pay | Source: Home / Self Care | Attending: Emergency Medicine

## 2021-12-16 ENCOUNTER — Encounter: Payer: Self-pay | Admitting: Internal Medicine

## 2021-12-16 ENCOUNTER — Observation Stay: Payer: Self-pay | Admitting: Certified Registered"

## 2021-12-16 DIAGNOSIS — R112 Nausea with vomiting, unspecified: Secondary | ICD-10-CM

## 2021-12-16 DIAGNOSIS — E0865 Diabetes mellitus due to underlying condition with hyperglycemia: Secondary | ICD-10-CM

## 2021-12-16 DIAGNOSIS — K920 Hematemesis: Secondary | ICD-10-CM

## 2021-12-16 DIAGNOSIS — K921 Melena: Secondary | ICD-10-CM

## 2021-12-16 DIAGNOSIS — R6510 Systemic inflammatory response syndrome (SIRS) of non-infectious origin without acute organ dysfunction: Secondary | ICD-10-CM

## 2021-12-16 DIAGNOSIS — R197 Diarrhea, unspecified: Secondary | ICD-10-CM

## 2021-12-16 HISTORY — PX: ESOPHAGOGASTRODUODENOSCOPY (EGD) WITH PROPOFOL: SHX5813

## 2021-12-16 LAB — CBC
HCT: 35 % — ABNORMAL LOW (ref 39.0–52.0)
Hemoglobin: 12.9 g/dL — ABNORMAL LOW (ref 13.0–17.0)
MCH: 31.9 pg (ref 26.0–34.0)
MCHC: 36.9 g/dL — ABNORMAL HIGH (ref 30.0–36.0)
MCV: 86.6 fL (ref 80.0–100.0)
Platelets: 255 10*3/uL (ref 150–400)
RBC: 4.04 MIL/uL — ABNORMAL LOW (ref 4.22–5.81)
RDW: 11.9 % (ref 11.5–15.5)
WBC: 7.3 10*3/uL (ref 4.0–10.5)
nRBC: 0 % (ref 0.0–0.2)

## 2021-12-16 LAB — COMPREHENSIVE METABOLIC PANEL
ALT: 15 U/L (ref 0–44)
AST: 15 U/L (ref 15–41)
Albumin: 3.6 g/dL (ref 3.5–5.0)
Alkaline Phosphatase: 57 U/L (ref 38–126)
Anion gap: 7 (ref 5–15)
BUN: 24 mg/dL — ABNORMAL HIGH (ref 6–20)
CO2: 23 mmol/L (ref 22–32)
Calcium: 8.7 mg/dL — ABNORMAL LOW (ref 8.9–10.3)
Chloride: 106 mmol/L (ref 98–111)
Creatinine, Ser: 0.86 mg/dL (ref 0.61–1.24)
GFR, Estimated: >60 mL/min (ref >60)
Glucose, Bld: 118 mg/dL — ABNORMAL HIGH (ref 70–99)
Potassium: 3.2 mmol/L — ABNORMAL LOW (ref 3.5–5.1)
Sodium: 136 mmol/L (ref 135–145)
Total Bilirubin: 0.8 mg/dL (ref 0.3–1.2)
Total Protein: 6.4 g/dL — ABNORMAL LOW (ref 6.5–8.1)

## 2021-12-16 LAB — URINE DRUG SCREEN, QUALITATIVE (ARMC ONLY)
Amphetamines, Ur Screen: NOT DETECTED
Barbiturates, Ur Screen: NOT DETECTED
Benzodiazepine, Ur Scrn: NOT DETECTED
Cannabinoid 50 Ng, Ur ~~LOC~~: POSITIVE — AB
Cocaine Metabolite,Ur ~~LOC~~: NOT DETECTED
MDMA (Ecstasy)Ur Screen: NOT DETECTED
Methadone Scn, Ur: NOT DETECTED
Opiate, Ur Screen: NOT DETECTED
Phencyclidine (PCP) Ur S: NOT DETECTED
Tricyclic, Ur Screen: NOT DETECTED

## 2021-12-16 LAB — HIV ANTIBODY (ROUTINE TESTING W REFLEX): HIV Screen 4th Generation wRfx: NONREACTIVE

## 2021-12-16 LAB — GLUCOSE, CAPILLARY
Glucose-Capillary: 179 mg/dL — ABNORMAL HIGH (ref 70–99)
Glucose-Capillary: 125 mg/dL — ABNORMAL HIGH (ref 70–99)

## 2021-12-16 LAB — MAGNESIUM: Magnesium: 2 mg/dL (ref 1.7–2.4)

## 2021-12-16 SURGERY — ESOPHAGOGASTRODUODENOSCOPY (EGD) WITH PROPOFOL
Anesthesia: General

## 2021-12-16 MED ORDER — SODIUM CHLORIDE 0.9 % IV SOLN: INTRAVENOUS | Status: DC

## 2021-12-16 MED ORDER — PROPOFOL 10 MG/ML IV BOLUS
INTRAVENOUS | Status: DC | PRN
  Administered 2021-12-16: 30 mg via INTRAVENOUS
  Administered 2021-12-16: 120 mg via INTRAVENOUS

## 2021-12-16 MED ORDER — PHENYLEPHRINE HCL (PRESSORS) 10 MG/ML IV SOLN
INTRAVENOUS | Status: DC | PRN
  Administered 2021-12-16: 80 ug via INTRAVENOUS

## 2021-12-16 MED ORDER — PANTOPRAZOLE SODIUM 40 MG PO TBEC: 40.0000 mg | DELAYED_RELEASE_TABLET | Freq: Every day | ORAL | 0 refills | Status: AC

## 2021-12-16 MED ORDER — BASAGLAR KWIKPEN 100 UNIT/ML ~~LOC~~ SOPN: 13.0000 [IU] | PEN_INJECTOR | Freq: Every day | SUBCUTANEOUS | 0 refills | Status: AC

## 2021-12-16 MED ORDER — POTASSIUM CHLORIDE CRYS ER 20 MEQ PO TBCR
40.0000 meq | EXTENDED_RELEASE_TABLET | Freq: Once | ORAL | Status: AC
  Administered 2021-12-16: 40 meq via ORAL
  Filled 2021-12-16: qty 2

## 2021-12-16 MED ORDER — SODIUM CHLORIDE 0.9 % IV SOLN: INTRAVENOUS | Status: DC | PRN

## 2021-12-16 MED ORDER — UNIFINE PENTIPS 32G X 4 MM MISC: 0 refills | Status: AC

## 2021-12-16 MED ORDER — GLIPIZIDE 5 MG PO TABS: 5.0000 mg | ORAL_TABLET | Freq: Two times a day (BID) | ORAL | 0 refills | Status: AC

## 2021-12-16 MED ORDER — LIDOCAINE HCL (CARDIAC) PF 100 MG/5ML IV SOSY
PREFILLED_SYRINGE | INTRAVENOUS | Status: DC | PRN
  Administered 2021-12-16: 100 mg via INTRAVENOUS

## 2021-12-16 NOTE — Transfer of Care (Signed)
Immediate Anesthesia Transfer of Care Note  Patient: Patrick Wright  Procedure(s) Performed: ESOPHAGOGASTRODUODENOSCOPY (EGD) WITH PROPOFOL  Patient Location: PACU  Anesthesia Type:General  Level of Consciousness: drowsy  Airway & Oxygen Therapy: Patient Spontanous Breathing  Post-op Assessment: Report given to RN and Post -op Vital signs reviewed and stable  Post vital signs: Reviewed and stable  Last Vitals:  Vitals Value Taken Time  BP 118/72 12/16/21 1137  Temp 36.1 C 12/16/21 1136  Pulse 64 12/16/21 1138  Resp 18 12/16/21 1138  SpO2 100 % 12/16/21 1138  Vitals shown include unvalidated device data.  Last Pain:  Vitals:   12/16/21 1136  TempSrc: Temporal  PainSc: Asleep         Complications: No notable events documented.

## 2021-12-16 NOTE — Anesthesia Preprocedure Evaluation (Signed)
Anesthesia Evaluation  Patient identified by MRN, date of birth, ID band Patient awake    Reviewed: Allergy & Precautions, H&P , NPO status , Patient's Chart, lab work & pertinent test results, reviewed documented beta blocker date and time   Airway Mallampati: II   Neck ROM: full    Dental  (+) Poor Dentition, Chipped, Missing, Loose   Pulmonary neg pulmonary ROS, Current Smoker,    Pulmonary exam normal        Cardiovascular Exercise Tolerance: Good hypertension, On Medications negative cardio ROS Normal cardiovascular exam Rhythm:regular Rate:Normal     Neuro/Psych negative neurological ROS  negative psych ROS   GI/Hepatic negative GI ROS, Neg liver ROS,   Endo/Other  negative endocrine ROSdiabetes, Well Controlled  Renal/GU Renal disease  negative genitourinary   Musculoskeletal   Abdominal   Peds  Hematology negative hematology ROS (+)   Anesthesia Other Findings Past Medical History: No date: Diabetes mellitus without complication (HCC) No date: Hyperlipidemia No date: Hypertension No date: Pancreatitis No date: Psoriasis Past Surgical History: No date: NO PAST SURGERIES BMI    Body Mass Index: 22.26 kg/m     Reproductive/Obstetrics negative OB ROS                             Anesthesia Physical Anesthesia Plan  ASA: 2  Anesthesia Plan: General   Post-op Pain Management:    Induction:   PONV Risk Score and Plan:   Airway Management Planned:   Additional Equipment:   Intra-op Plan:   Post-operative Plan:   Informed Consent: I have reviewed the patients History and Physical, chart, labs and discussed the procedure including the risks, benefits and alternatives for the proposed anesthesia with the patient or authorized representative who has indicated his/her understanding and acceptance.     Dental Advisory Given  Plan Discussed with: CRNA  Anesthesia  Plan Comments:         Anesthesia Quick Evaluation

## 2021-12-16 NOTE — Consult Note (Signed)
Lucilla Lame, MD [facility]  [address], [place], [place] [address] Phone: [phone] Fax : [phone]  Consultation  Referring Provider:     Dr. Damita Dunnings Primary Care Physician:  Merryl Hacker, No Primary Gastroenterologist: Althia Forts Reason for Consultation:     Hematemesis  Date of Admission:  12/15/2021 Date of Consultation:  12/16/2021         HPI:   Patrick Wright is a 41 y.o. male who has a history of abdominal pain that he reports to be worse in the last 3 days.  The patient has been in the hospital before with abdominal pain with nausea and vomiting.  The patient states he had nausea vomiting with coffee-ground emesis.  The patient also reports that he has had black stools.  The patient takes approximately 3 Advil a week.  He states he takes this for pain.  The patient came in with some hypotension and was given IV fluids with correction of the hypertension.  The patient's BUN and creatinine were high on admission with a BUN of 43 and a creatinine of 2.03.  The repeat this morning showed the BUN to be elevated at 24 with the creatinine being normal. The patient's hemoglobin on admission was 15.9 with it coming down to 12.9 today with hydration.  Past Medical History:  Diagnosis Date   Diabetes mellitus without complication ([place])    Hyperlipidemia    Hypertension    Pancreatitis    Psoriasis     Past Surgical History:  Procedure Laterality Date   NO PAST SURGERIES      Prior to Admission medications   Medication Sig Start Date End Date Taking? Authorizing Provider  atorvastatin (LIPITOR) 40 MG tablet Take 1 tablet (40 mg total) by mouth daily at 6 PM. Patient not taking: Reported on 07/29/2021 05/07/19   Iloabachie, Chioma E, NP  blood glucose meter kit and supplies KIT Dispense based on patient and insurance preference. Use up to four times daily as directed. (FOR ICD-9 250.00, 250.01). Patient not taking: Reported on 12/15/2021 05/25/17   Vaughan Basta,  MD  GARLIC PO Take 1 tablet by mouth daily. Patient not taking: Reported on 12/15/2021    [provider]  glipiZIDE (GLUCOTROL) 5 MG tablet Take 1 tablet (5 mg total) by mouth 2 (two) times daily before a meal. Patient not taking: Reported on 11/03/2021 08/03/21   Iloabachie, Chioma E, NP  Ibuprofen (ADVIL LIQUI-GELS MINIS) 200 MG CAPS Take 400 mg by mouth 2 (two) times daily as needed. Patient not taking: Reported on 12/15/2021    [provider]  Insulin Glargine (BASAGLAR KWIKPEN) 100 UNIT/ML Inject 13 Units into the skin at bedtime. Patient not taking: Reported on 12/15/2021 10/12/21   Iloabachie, Chioma E, NP  Insulin Pen Needle (UNIFINE PENTIPS) 32G X 4 MM MISC Use as directed Patient not taking: Reported on 12/15/2021 11/03/21   Langston Reusing, NP    Family History  Problem Relation Age of Onset   Diabetes Mother    Alcohol abuse Father    Cirrhosis Father    Diabetes Sister    Diabetes Brother    Other Maternal Grandmother        unknown medical history   Other Maternal Grandfather        unknown medical history   Other Paternal Grandmother        unknown medical history   Other Paternal Grandfather        unknown medical  history     Social History   Tobacco Use   Smoking status: Some Days    Packs/day: 1.00    Years: 30.00    Total pack years: 30.00    Types: Cigarettes    Start date: 25    Last attempt to quit: 06/22/2021    Years since quitting: 0.4   Smokeless tobacco: Never   Tobacco comments:    1-2 cigarettes per week  Vaping Use   Vaping Use: Never used  Substance Use Topics   Alcohol use: Yes    Alcohol/week: 24.0 standard drinks of alcohol    Types: 24 Cans of beer per week    Comment: last use 09/2021 (drank 3 beers)   Drug use: Yes    Frequency: 3.0 times per week    Types: Marijuana    Allergies as of 12/15/2021   (No Known Allergies)    Review of Systems:    All systems reviewed and negative except where noted in  HPI.   Physical Exam:  Vital signs in last 24 hours: Temp:  [97.8 F (36.6 C)-98.8 F (37.1 C)] 98.5 F (36.9 C) (10/26 0720) Pulse Rate:  [77-116] 77 (10/26 0800) Resp:  [14-17] 17 (10/26 0800) BP: (81-104)/(54-77) 103/77 (10/26 0800) SpO2:  [99 %-100 %] 100 % (10/26 0800) Weight:  [55.2 kg] 55.2 kg (10/25 1310)   General:   Pleasant, cooperative in NAD Head:  Normocephalic and atraumatic. Eyes:   No icterus.   Conjunctiva pink. PERRLA. Ears:  Normal auditory acuity. Neck:  Supple; no masses or thyroidomegaly Lungs: Respirations even and unlabored. Lungs clear to auscultation bilaterally.   No wheezes, crackles, or rhonchi.  Heart:  Regular rate and rhythm;  Without murmur, clicks, rubs or gallops Abdomen:  Soft, nondistended, nontender. Normal bowel sounds. No appreciable masses or hepatomegaly.  No rebound or guarding.  Rectal:  Not performed. Msk:  Symmetrical without gross deformities.   Extremities:  Without edema, cyanosis or clubbing. Neurologic:  Alert and oriented x3;  grossly normal neurologically. Skin:  Intact without significant lesions or rashes. Cervical Nodes:  No significant cervical adenopathy. Psych:  Alert and cooperative. Normal affect.  LAB RESULTS: Recent Labs    12/15/21 1311 12/16/21 0320  WBC 8.0 7.3  HGB 15.9 12.9*  HCT 44.4 35.0*  PLT 341 255   BMET Recent Labs    12/15/21 1311 12/16/21 0320  NA 135 136  K 3.6 3.2*  CL 98 106  CO2 26 23  GLUCOSE 181* 118*  BUN 43* 24*  CREATININE 2.03* 0.86  CALCIUM 9.8 8.7*   LFT Recent Labs    12/16/21 0320  PROT 6.4*  ALBUMIN 3.6  AST 15  ALT 15  ALKPHOS 57  BILITOT 0.8   PT/INR Recent Labs    12/15/21 1745  LABPROT 13.6  INR 1.1    STUDIES: DG Chest Port 1 View  Result Date: 12/15/2021 CLINICAL DATA:  Left lower quadrant abdominal pain and vomiting for 3 days EXAM: PORTABLE CHEST 1 VIEW COMPARISON:  11/12/2020 FINDINGS: Single frontal view of the chest demonstrates an  unremarkable cardiac silhouette. No airspace disease, effusion, or pneumothorax. No acute bony abnormalities. IMPRESSION: 1. No acute intrathoracic process. Electronically Signed   By: Randa Ngo M.D.   On: 12/15/2021 19:33   CT Abdomen Pelvis W Contrast  Result Date: 12/15/2021 CLINICAL DATA:  Left upper and lower quadrant abdominal pain with vomiting for 3 days. Patient reports similar symptoms previously which have resolved. EXAM: CT  ABDOMEN AND PELVIS WITH CONTRAST TECHNIQUE: Multidetector CT imaging of the abdomen and pelvis was performed using the standard protocol following bolus administration of intravenous contrast. RADIATION DOSE REDUCTION: This exam was performed according to the departmental dose-optimization program which includes automated exposure control, adjustment of the mA and/or kV according to patient size and/or use of iterative reconstruction technique. CONTRAST:  39m OMNIPAQUE IOHEXOL 300 MG/ML  SOLN COMPARISON:  Abdominopelvic CT 07/07/2021 and 01/14/2020. FINDINGS: Lower chest: Clear lung bases. No significant pleural or pericardial effusion. 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 Hepatobiliary: The liver is normal in density without suspicious focal abnormality. No evidence of gallstones, gallbladder wall thickening or biliary dilatation. Pancreas: Unremarkable. No pancreatic ductal dilatation or surrounding inflammatory changes. Spleen: Normal in size without focal abnormality. Adrenals/Urinary Tract: Both adrenal glands appear normal. No evidence of urinary tract calculus, suspicious renal lesion or hydronephrosis. The bladder appears unremarkable for its degree of distention. Stomach/Bowel: No enteric contrast administered. The stomach appears unremarkable for its degree of distention. The small bowel, appendix and proximal colon appear normal, without distension, wall  thickening or surrounding inflammation. There is descending and sigmoid diverticulosis without evidence of recurrent acute inflammation. No evidence of bowel obstruction or perforation. Vascular/Lymphatic: There are no enlarged abdominal or pelvic lymph nodes. No significant vascular findings. Reproductive: The prostate gland appears unremarkable. Other: Intact abdominal wall. No ascites, free air or focal extraluminal fluid collection. Musculoskeletal: No acute or significant osseous findings. Scattered incidental bone islands are unchanged. IMPRESSION: 1. No acute findings or explanation for the patient's symptoms. 2. Distal colonic diverticulosis without evidence of recurrent acute inflammation. 3. 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 Electronically Signed   By: WRichardean SaleM.D.   On: 12/15/2021 18:19      Impression / Plan:   Assessment: Active Problems:   Diabetes mellitus without complication (HCC)   Abdominal pain   Alcohol use disorder in remission   AKI (acute kidney injury) ([facility]   Hypotension   Intractable vomiting with nausea   SIRS (systemic inflammatory response syndrome) ([facility]   JDenneySElveria Royalsis a 41y.o. y/o male with abdominal pain with melena and hematemesis.  The patient has had a drop in his hemoglobin but that is after hydration.  Despite multiple admissions for nausea vomiting abdominal pain in the past patient has never been seen by GI.  Plan:  This patient was admitted with hematemesis nausea vomiting and melena.  The patient has been on NSAIDs.  The patient should be treated with a PPI and will be brought up to the endoscopy unit for a upper endoscopy for today.  The patient has been explained the plan agrees with it.  Thank you for involving me in the care of this patient.      LOS: 0 days   DLucilla Lame MD, FNorwood Hlth Ctr10/26/2023, 8:27 AM,  Pager [phone] 7am-5pm  Check AMION for 5pm -7am coverage and on weekends   Note: This dictation was prepared with Dragon dictation along with smaller phrase technology. Any transcriptional errors that result from this process are unintentional.

## 2021-12-16 NOTE — Assessment & Plan Note (Signed)
Possible sepsis Hypotension, tachycardia , possible infection Continue fluid resuscitation Continue Rocephin and Flagyl for now Follow procalcitonin

## 2021-12-16 NOTE — Op Note (Signed)
[facility] Patient Name: Patrick Wright Procedure Date: 12/16/2021 11:18 AM MRN: [id number] Account #: [id number] Date of Birth: 1980-10-08 Admit Type: Outpatient Age: 41 Room: [facility] ENDO ROOM 4 Gender: Male Note Status: Finalized Instrument Name: Upper Endoscope [id number] Procedure:             Upper GI endoscopy Indications:           Coffee-ground emesis Providers:             Lucilla Lame MD, MD Referring MD:          No Local Md, MD (Referring MD) Medicines:             Propofol per Anesthesia Complications:         No immediate complications. Procedure:             Pre-Anesthesia Assessment:                        - Prior to the procedure, a History and Physical was                         performed, and patient medications and allergies were                         reviewed. The patient's tolerance of previous                         anesthesia was also reviewed. The risks and benefits                         of the procedure and the sedation options and risks                         were discussed with the patient. All questions were                         answered, and informed consent was obtained. Prior                         Anticoagulants: The patient has taken no anticoagulant                         or antiplatelet agents. ASA Grade Assessment: II - A                         patient with mild systemic disease. After reviewing                         the risks and benefits, the patient was deemed in                         satisfactory condition to undergo the procedure.                        After obtaining informed consent, the endoscope was                         passed under direct vision. Throughout the procedure,  the patient's blood pressure, pulse, and oxygen                         saturations were monitored continuously. The                         Endosonoscope was introduced through the  mouth, and                         advanced to the second part of duodenum. The upper GI                         endoscopy was accomplished without difficulty. The                         patient tolerated the procedure well. Findings:      The examined esophagus was normal.      The entire examined stomach was normal.      The examined duodenum was normal. Impression:            - Normal esophagus.                        - Normal stomach.                        - Normal examined duodenum.                        - No specimens collected. Recommendation:        - Return patient to hospital ward for ongoing care.                        - Resume previous diet.                        - Continue present medications.                        - No sign of any active or recent GI bleeding.                        Follow Hb Procedure Code(s):     --- Professional ---                        [phone], Esophagogastroduodenoscopy, flexible,                         transoral; diagnostic, including collection of                         specimen(s) by brushing or washing, when performed                         (separate procedure) Diagnosis Code(s):     --- Professional ---                        K92.0, Hematemesis CPT copyright 2022 American Medical Association. All rights reserved. The codes documented in this report are preliminary and upon coder review may  be revised to meet current  compliance requirements. Lucilla Lame MD, MD 12/16/2021 11:34:06 AM This report has been signed electronically. Number of Addenda: 0 Note Initiated On: 12/16/2021 11:18 AM Estimated Blood Loss:  Estimated blood loss: none.      [facility]

## 2021-12-16 NOTE — Progress Notes (Signed)
Patient admitted to the unit and placed on telemetry. A skin assessment was completed by myself and Jerrel Ivory. The patient has areas on his back, arms, and legs from psoriasis. No other skin issues noted at this time.

## 2021-12-17 ENCOUNTER — Encounter: Payer: Self-pay | Admitting: Gastroenterology

## 2021-12-17 LAB — URINE CULTURE: Culture: NO GROWTH

## 2021-12-17 NOTE — Anesthesia Postprocedure Evaluation (Signed)
Anesthesia Post Note  Patient: Patrick Wright  Procedure(s) Performed: ESOPHAGOGASTRODUODENOSCOPY (EGD) WITH PROPOFOL  Patient location during evaluation: PACU Anesthesia Type: General Level of consciousness: awake and alert Pain management: pain level controlled Vital Signs Assessment: post-procedure vital signs reviewed and stable Respiratory status: spontaneous breathing, nonlabored ventilation, respiratory function stable and patient connected to nasal cannula oxygen Cardiovascular status: blood pressure returned to baseline and stable Postop Assessment: no apparent nausea or vomiting Anesthetic complications: no   No notable events documented.   Last Vitals:  Vitals:   12/16/21 1216 12/16/21 1230  BP: 92/69 98/67  Pulse: 79 83  Resp: 11 17  Temp:  36.8 C  SpO2: 100% 100%    Last Pain:  Vitals:   12/16/21 1230  TempSrc: Oral  PainSc:                  Molli Barrows

## 2021-12-18 NOTE — Discharge Summary (Signed)
Physician Discharge Summary   Patient: Patrick Wright MRN: [id number] DOB: [birth year]  Admit date:     12/15/2021  Discharge date: 12/16/2021  Discharge Physician: Max Sane   PCP: Pcp, No   Recommendations at discharge:   Follow-up with outpatient providers as requested  Discharge Diagnoses: Active Problems:   Abdominal pain   AKI (acute kidney injury) ([place])   Hypotension   SIRS (systemic inflammatory response syndrome) (HCC)   Diabetes mellitus without complication (HCC)   Alcohol use disorder in remission   Intractable vomiting with nausea   Melena   Hematemesis with nausea   Diarrhea of presumed infectious origin  Hospital Course: Assessment and Plan: Abdominal pain Intractable vomiting Suspect acute gastritis Patient presents with 3 days of intractable nausea and vomiting and mostly upper  left-sided abdominal pain, reports of black stool.  No further black stool in the hospital.  His nausea and vomiting had resolved.  He tolerated diet without any issues H&H remained stable Hyperbilirubinemia with otherwise normal LFTs and normal lipase and COVID-negative CT abdomen and pelvis nonacute, urinalysis unremarkable and chest x-ray clear Underwent EGD which was within normal limits  SIRS (systemic inflammatory response syndrome) (HCC) Possible sepsis Ruled out  Hypotension Secondary to intractable nausea and vomiting Improved with hydration  AKI (acute kidney injury) ([place]) Creatinine 2.03 above baseline of 0.84 Secondary to  ATN from volume depletion from intractable vomiting Improved with hydration  Alcohol use disorder in remission Patient has reportedly not taken a drink in 2 months  Diabetes mellitus without complication [facility])        Consultants: GI Procedures performed: EGD on 10/26 Disposition: Home Diet recommendation:  Discharge Diet Orders (From admission, onward)     Start     Ordered   12/16/21 0000  Diet - low sodium heart healthy         12/16/21 1420           Carb modified diet DISCHARGE MEDICATION: Allergies as of 12/16/2021   No Known Allergies      Medication List     STOP taking these medications    Advil Liqui-Gels minis 200 MG Caps Generic drug: Ibuprofen   atorvastatin 40 MG tablet Commonly known as: LIPITOR   blood glucose meter kit and supplies Kit   GARLIC PO       TAKE these medications    Basaglar KwikPen 100 UNIT/ML Inyecte 13 Unidades en la piel antes de acostarse. (Inject 13 Units into the skin at bedtime.)   glipiZIDE 5 MG tablet Commonly known as: GLUCOTROL Tome 1 tableta (5 mg en total) por va oral 2 (dos) veces al da antes de una comida. (Take 1 tablet (5 mg total) by mouth 2 (two) times daily before a meal.)   pantoprazole 40 MG tablet Commonly known as: Protonix Take 1 tablet (40 mg total) by mouth daily.   Unifine Pentips 32G X 4 MM Misc Generic drug: Insulin Pen Needle Use as directed        Follow-up Information     [facility]. Schedule an appointment as soon as possible for a visit in 1 week(s).   Specialty: Primary Care Why: Appointment on Wednesday, 12/22/2021 at 3:30pm. Contact information: [address] [place] [phone]               Discharge Exam: Danley Danker Weights   12/15/21 1310 12/16/21 1051  Weight: 55.2 kg 54.4 kg   Constitutional:  General: He is not in acute distress. HENT:     Head: Normocephalic and atraumatic.  Cardiovascular:     Rate and Rhythm: Normal rate and regular rhythm.     Heart sounds: Normal heart sounds.  Pulmonary:     Effort: Pulmonary effort is normal.     Breath sounds: Normal breath sounds.  Abdominal:     Palpations: Abdomen is soft.     Tenderness: There is no abdominal tenderness.  Neurological:     Mental Status: Mental status is at baseline.  Condition at discharge: good  The results of significant diagnostics from this  hospitalization (including imaging, microbiology, ancillary and laboratory) are listed below for reference.   Imaging Studies: DG Chest Port 1 View  Result Date: 12/15/2021 CLINICAL DATA:  Left lower quadrant abdominal pain and vomiting for 3 days EXAM: PORTABLE CHEST 1 VIEW COMPARISON:  11/12/2020 FINDINGS: Single frontal view of the chest demonstrates an unremarkable cardiac silhouette. No airspace disease, effusion, or pneumothorax. No acute bony abnormalities. IMPRESSION: 1. No acute intrathoracic process. Electronically Signed   By: Randa Ngo M.D.   On: 12/15/2021 19:33   CT Abdomen Pelvis W Contrast  Result Date: 12/15/2021 CLINICAL DATA:  Left upper and lower quadrant abdominal pain with vomiting for 3 days. Patient reports similar symptoms previously which have resolved. EXAM: CT ABDOMEN AND PELVIS WITH CONTRAST TECHNIQUE: Multidetector CT imaging of the abdomen and pelvis was performed using the standard protocol following bolus administration of intravenous contrast. RADIATION DOSE REDUCTION: This exam was performed according to the departmental dose-optimization program which includes automated exposure control, adjustment of the mA and/or kV according to patient size and/or use of iterative reconstruction technique. CONTRAST:  81m OMNIPAQUE IOHEXOL 300 MG/ML  SOLN COMPARISON:  Abdominopelvic CT 07/07/2021 and 01/14/2020. FINDINGS: Lower chest: Clear lung bases. No significant pleural or pericardial effusion. 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 Hepatobiliary: The liver is normal in density without suspicious focal abnormality. No evidence of gallstones, gallbladder wall thickening or biliary dilatation. Pancreas: Unremarkable. No pancreatic ductal dilatation or surrounding inflammatory changes. Spleen: Normal in size without focal abnormality. Adrenals/Urinary Tract: Both adrenal glands  appear normal. No evidence of urinary tract calculus, suspicious renal lesion or hydronephrosis. The bladder appears unremarkable for its degree of distention. Stomach/Bowel: No enteric contrast administered. The stomach appears unremarkable for its degree of distention. The small bowel, appendix and proximal colon appear normal, without distension, wall thickening or surrounding inflammation. There is descending and sigmoid diverticulosis without evidence of recurrent acute inflammation. No evidence of bowel obstruction or perforation. Vascular/Lymphatic: There are no enlarged abdominal or pelvic lymph nodes. No significant vascular findings. Reproductive: The prostate gland appears unremarkable. Other: Intact abdominal wall. No ascites, free air or focal extraluminal fluid collection. Musculoskeletal: No acute or significant osseous findings. Scattered incidental bone islands are unchanged. IMPRESSION: 1. No acute findings or explanation for the patient's symptoms. 2. Distal colonic diverticulosis without evidence of recurrent acute inflammation. 3. 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 Electronically Signed   By: WRichardean SaleM.D.   On: 12/15/2021 18:19    Microbiology: Results for orders placed or performed during the hospital encounter of 12/15/21  Urine Culture     Status: None   Collection Time: 12/15/21  6:40 PM   Specimen: In/Out Cath Urine  Result Value Ref Range  Status   Specimen Description   Final    IN/OUT CATH URINE Performed at [facility], [address] [place], [place] [address]    Special Requests   Final    NONE Performed at [facility], [address] [place], [place] [address]    Culture   Final    NO GROWTH Performed at [facility], [place] [address], [place], [place] [address]    Report Status 12/17/2021 FINAL  Final  Resp Panel by RT-PCR (Flu  A&B, Covid) Anterior Nasal Swab     Status: None   Collection Time: 12/15/21  7:00 PM   Specimen: Anterior Nasal Swab  Result Value Ref Range Status   SARS Coronavirus 2 by RT PCR NEGATIVE NEGATIVE Final    Comment: (NOTE) SARS-CoV-2 target nucleic acids are NOT DETECTED.  The SARS-CoV-2 RNA is generally detectable in upper respiratory specimens during the acute phase of infection. The lowest concentration of SARS-CoV-2 viral copies this assay can detect is 138 copies/mL. A negative result does not preclude SARS-Cov-2 infection and should not be used as the sole basis for treatment or other patient management decisions. A negative result may occur with  improper specimen collection/handling, submission of specimen other than nasopharyngeal swab, presence of viral mutation(s) within the areas targeted by this assay, and inadequate number of viral copies(<138 copies/mL). A negative result must be combined with clinical observations, patient history, and epidemiological information. The expected result is Negative.  Fact Sheet for Patients:  EntrepreneurPulse.com.au  Fact Sheet for Healthcare Providers:  IncredibleEmployment.be  This test is no t yet approved or cleared by the [place] FDA and  has been authorized for detection and/or diagnosis of SARS-CoV-2 by FDA under an Emergency Use Authorization (EUA). This EUA will remain  in effect (meaning this test can be used) for the duration of the COVID-19 declaration under Section 564(b)(1) of the Act, 21 U.S.C.section 360bbb-3(b)(1), unless the authorization is terminated  or revoked sooner.       Influenza A by PCR NEGATIVE NEGATIVE Final   Influenza B by PCR NEGATIVE NEGATIVE Final    Comment: (NOTE) The Xpert Xpress SARS-CoV-2/FLU/RSV plus assay is intended as an aid in the diagnosis of influenza from Nasopharyngeal swab specimens and should not be used as a sole basis for treatment.  Nasal washings and aspirates are unacceptable for Xpert Xpress SARS-CoV-2/FLU/RSV testing.  Fact Sheet for Patients: EntrepreneurPulse.com.au  Fact Sheet for Healthcare Providers: IncredibleEmployment.be  This test is not yet approved or cleared by the [place] FDA and has been authorized for detection and/or diagnosis of SARS-CoV-2 by FDA under an Emergency Use Authorization (EUA). This EUA will remain in effect (meaning this test can be used) for the duration of the COVID-19 declaration under Section 564(b)(1) of the Act, 21 U.S.C. section 360bbb-3(b)(1), unless the authorization is terminated or revoked.  Performed at [facility], [address] [place], [place] [address]   Blood Culture (routine x 2)     Status: None (Preliminary result)   Collection Time: 12/15/21  7:18 PM   Specimen: Right Antecubital; Blood  Result Value Ref Range Status   Specimen Description RIGHT ANTECUBITAL  Final   Special Requests   Final    BOTTLES DRAWN AEROBIC AND ANAEROBIC Blood Culture results may not be optimal due to an inadequate volume of blood received in culture bottles   Culture   Final    NO GROWTH 2 DAYS Performed at [facility], [address] [place], [place] [address]    Report Status PENDING  Incomplete  Blood Culture (routine x 2)     Status: None (Preliminary result)   Collection Time: 12/15/21  7:18 PM   Specimen: BLOOD LEFT FOREARM  Result Value Ref Range Status   Specimen Description BLOOD LEFT FOREARM  Final   Special Requests   Final    BOTTLES DRAWN AEROBIC AND ANAEROBIC Blood Culture results may not be optimal due to an inadequate volume of blood received in culture bottles   Culture   Final    NO GROWTH 2 DAYS Performed at [facility], [address] [place], [place] [address]    Report Status PENDING  Incomplete    Labs: CBC: Recent Labs  Lab 12/15/21 1311 12/16/21 0320  WBC 8.0  7.3  HGB 15.9 12.9*  HCT 44.4 35.0*  MCV 87.1 86.6  PLT 341 286   Basic Metabolic Panel: Recent Labs  Lab 12/15/21 1311 12/16/21 0315 12/16/21 0320  NA 135  --  136  K 3.6  --  3.2*  CL 98  --  106  CO2 26  --  23  GLUCOSE 181*  --  118*  BUN 43*  --  24*  CREATININE 2.03*  --  0.86  CALCIUM 9.8  --  8.7*  MG  --  2.0  --    Liver Function Tests: Recent Labs  Lab 12/15/21 1311 12/16/21 0320  AST 17 15  ALT 19 15  ALKPHOS 80 57  BILITOT 1.4* 0.8  PROT 8.6* 6.4*  ALBUMIN 5.1* 3.6   CBG: Recent Labs  Lab 12/15/21 2247 12/16/21 0719  GLUCAP 179* 125*    Discharge time spent: greater than 30 minutes.  Signed: Max Sane, MD [facility] 12/18/2021

## 2021-12-20 LAB — CULTURE, BLOOD (ROUTINE X 2)
Culture: NO GROWTH
Culture: NO GROWTH

## 2021-12-22 ENCOUNTER — Ambulatory Visit: Payer: Self-pay | Admitting: Gerontology

## 2022-01-27 ENCOUNTER — Ambulatory Visit: Payer: Self-pay | Admitting: Gerontology

## 2022-11-16 ENCOUNTER — Other Ambulatory Visit: Payer: Self-pay

## 2022-11-16 ENCOUNTER — Emergency Department (HOSPITAL_COMMUNITY)
Admission: EM | Admit: 2022-11-16 | Discharge: 2022-11-16 | Disposition: A | Discharge status: Home / Self Care | Payer: Self-pay

## 2022-11-16 ENCOUNTER — Emergency Department (HOSPITAL_COMMUNITY): Payer: Self-pay

## 2022-11-16 DIAGNOSIS — Z7984 Long term (current) use of oral hypoglycemic drugs: Secondary | ICD-10-CM | POA: Insufficient documentation

## 2022-11-16 DIAGNOSIS — I10 Essential (primary) hypertension: Secondary | ICD-10-CM | POA: Insufficient documentation

## 2022-11-16 DIAGNOSIS — Z23 Encounter for immunization: Secondary | ICD-10-CM | POA: Insufficient documentation

## 2022-11-16 DIAGNOSIS — W260XXA Contact with knife, initial encounter: Secondary | ICD-10-CM | POA: Insufficient documentation

## 2022-11-16 DIAGNOSIS — E119 Type 2 diabetes mellitus without complications: Secondary | ICD-10-CM | POA: Insufficient documentation

## 2022-11-16 DIAGNOSIS — Z794 Long term (current) use of insulin: Secondary | ICD-10-CM | POA: Insufficient documentation

## 2022-11-16 DIAGNOSIS — S41112A Laceration without foreign body of left upper arm, initial encounter: Secondary | ICD-10-CM | POA: Insufficient documentation

## 2022-11-16 MED ORDER — TETANUS-DIPHTH-ACELL PERTUSSIS 5-2.5-18.5 LF-MCG/0.5 IM SUSY
0.5000 mL | PREFILLED_SYRINGE | Freq: Once | INTRAMUSCULAR | Status: AC
  Administered 2022-11-16: 0.5 mL via INTRAMUSCULAR
  Filled 2022-11-16: qty 0.5

## 2022-11-16 MED ORDER — HYDROCODONE-ACETAMINOPHEN 5-325 MG PO TABS
1.0000 | ORAL_TABLET | Freq: Once | ORAL | Status: AC
  Administered 2022-11-16: 1 via ORAL
  Filled 2022-11-16: qty 1

## 2022-11-16 MED ORDER — IBUPROFEN 600 MG PO TABS: 600.0000 mg | ORAL_TABLET | Freq: Four times a day (QID) | ORAL | 0 refills | Status: AC | PRN

## 2022-11-16 MED ORDER — LIDOCAINE-EPINEPHRINE (PF) 2 %-1:200000 IJ SOLN
10.0000 mL | Freq: Once | INTRAMUSCULAR | Status: AC
  Administered 2022-11-16: 10 mL
  Filled 2022-11-16: qty 20

## 2022-11-16 MED ORDER — CEPHALEXIN 500 MG PO CAPS: 500.0000 mg | ORAL_CAPSULE | Freq: Four times a day (QID) | ORAL | 0 refills | Status: AC

## 2022-11-16 NOTE — Discharge Instructions (Addendum)
As discussed, your laceration was repaired using 3 nonabsorbable stitches.  You should be removed around 10 days from now.  Recommend treatment of pain at home with Tylenol/Motrin.  Will place you on an antibiotic for prevention of infection given history of diabetes.  Recommend follow-up at primary care, urgent care or back at the emergency department for suture removal in 10 days.  Recommend washing area gently with warm soapy water but avoid submersion in bodies of water.  Recommend limiting lifting of your left arm to 5 to 10 pound objects for the next 10 to 14 days until area is healed.  Please do not hesitate to return to emergency department if there are worrisome signs and symptoms we discussed become apparent.

## 2022-11-16 NOTE — ED Triage Notes (Signed)
Left upper arm laceration pt states he cut himself with a blade at work.

## 2022-11-16 NOTE — ED Provider Notes (Signed)
[place] EMERGENCY DEPARTMENT AT [facility] Provider Note   CSN: [id number] Arrival date & time: 11/16/22  5462     History  Chief Complaint  Patient presents with   Extremity Laceration    Patrick Wright is a 42 y.o. male.  HPI   42 year old male presents emergency department with complaints of laceration.  Patient states that he was cutting a plastic box with a box cutter when the knife slipped causing a stab incision on his left upper arm.  States areas been painful but denies any appreciable weakness.  States he is unaware of when his last tetanus shot was and is requesting update.  Denies any numbness distal to injury.  Denies any trauma elsewhere.  Past medical history significant for diabetes mellitus, hypertension, hyperlipidemia, pancreatitis, DKA, HHS  Home Medications Prior to Admission medications   Medication Sig Start Date End Date Taking? Authorizing Provider  cephALEXin (KEFLEX) 500 MG capsule Take 1 capsule (500 mg total) by mouth 4 (four) times daily. 11/16/22  Yes Sherian Maroon A, PA  ibuprofen (ADVIL) 600 MG tablet Take 1 tablet (600 mg total) by mouth every 6 (six) hours as needed. 11/16/22  Yes Sherian Maroon A, PA  glipiZIDE (GLUCOTROL) 5 MG tablet Take 1 tablet (5 mg total) by mouth 2 (two) times daily before a meal. 12/16/21 01/15/22  Delfino Lovett, MD  Insulin Glargine Avala KWIKPEN) 100 UNIT/ML Inject 13 Units into the skin at bedtime. 12/16/21 01/15/22  Delfino Lovett, MD  Insulin Pen Needle (UNIFINE PENTIPS) 32G X 4 MM MISC Use as directed 12/16/21   Delfino Lovett, MD  pantoprazole (PROTONIX) 40 MG tablet Take 1 tablet (40 mg total) by mouth daily. 12/16/21 01/15/22  Delfino Lovett, MD      Allergies    Patient has no known allergies.    Review of Systems   Review of Systems  All other systems reviewed and are negative.   Physical Exam Updated Vital Signs BP (!) 111/94   Pulse 81   Temp 97.7 F (36.5 C) (Oral)   Resp 16   Ht  5\' 2"  (1.575 m)   Wt 54.4 kg   SpO2 100%   BMI 21.94 kg/m  Physical Exam Vitals and nursing note reviewed.  Constitutional:      General: He is not in acute distress.    Appearance: He is well-developed.  HENT:     Head: Normocephalic and atraumatic.  Eyes:     Conjunctiva/sclera: Conjunctivae normal.  Cardiovascular:     Rate and Rhythm: Normal rate and regular rhythm.     Heart sounds: No murmur heard. Pulmonary:     Effort: Pulmonary effort is normal. No respiratory distress.     Breath sounds: Normal breath sounds.  Abdominal:     Palpations: Abdomen is soft.     Tenderness: There is no abdominal tenderness.  Musculoskeletal:        General: No swelling.       Arms:     Cervical back: Neck supple.     Comments: Stab incision noted distal left anterior upper arm measuring 2.6 cm in length and linear.  Patient with full range of motion of left elbow with symmetric strength in elbow flexion/extension 5 out of 5.  Radial pulses 2+ bilaterally.  No obvious foreign body in wound.  No sensory deficits distally.  Skin:    General: Skin is warm and dry.     Capillary Refill: Capillary refill takes less than 2 seconds.  Neurological:     Mental Status: He is alert.  Psychiatric:        Mood and Affect: Mood normal.     ED Results / Procedures / Treatments   Labs (all labs ordered are listed, but only abnormal results are displayed) Labs Reviewed - No data to display  EKG None  Radiology DG Elbow 2 Views Left  Result Date: 11/16/2022 CLINICAL DATA:  Cut upper arm with blade at work. EXAM: LEFT ELBOW - 2 VIEW COMPARISON:  None FINDINGS: There is a potential laceration about the anterior aspect of the humerus without associated fracture. No subcutaneous emphysema or radiopaque foreign body. No elbow joint effusion. Joint spaces are preserved. IMPRESSION: Potential laceration about the anterior aspect of the humerus without associated fracture, subcutaneous emphysema or  radiopaque foreign body. Electronically Signed   By: Simonne Come M.D.   On: 11/16/2022 11:59    Procedures .Marland KitchenLaceration Repair  Date/Time: 11/16/2022 11:59 AM  Performed by: Peter Garter, PA Authorized by: Peter Garter, PA   Consent:    Consent obtained:  Verbal   Consent given by:  Patient   Risks, benefits, and alternatives were discussed: yes     Risks discussed:  Infection, need for additional repair, pain, retained foreign body, tendon damage, vascular damage, poor wound healing, poor cosmetic result and nerve damage   Alternatives discussed:  Delayed treatment, no treatment, observation and referral Universal protocol:    Procedure explained and questions answered to patient or proxy's satisfaction: yes     Patient identity confirmed:  Verbally with patient Anesthesia:    Anesthesia method:  Local infiltration   Local anesthetic:  Lidocaine 1% WITH epi Laceration details:    Location:  Shoulder/arm   Shoulder/arm location:  L upper arm   Length (cm):  2.6 Exploration:    Hemostasis achieved with:  Direct pressure   Imaging obtained: x-ray     Imaging outcome: foreign body not noted     Wound exploration: wound explored through full range of motion and entire depth of wound visualized     Contaminated: no   Treatment:    Area cleansed with:  Saline and povidone-iodine   Amount of cleaning:  Standard   Irrigation solution:  Sterile saline   Irrigation volume:  250cc   Irrigation method:  Syringe   Visualized foreign bodies/material removed: no     Debridement:  None   Undermining:  None   Scar revision: no   Skin repair:    Repair method:  Sutures   Suture size:  4-0   Suture material:  Prolene   Number of sutures:  3 Approximation:    Approximation:  Close Repair type:    Repair type:  Simple Post-procedure details:    Dressing:  Open (no dressing)   Procedure completion:  Tolerated well, no immediate complications     Medications Ordered in  ED Medications  lidocaine-EPINEPHrine (XYLOCAINE W/EPI) 2 %-1:200000 (PF) injection 10 mL (10 mLs Infiltration Given by Other 11/16/22 1124)  Tdap (BOOSTRIX) injection 0.5 mL (0.5 mLs Intramuscular Given 11/16/22 1020)  HYDROcodone-acetaminophen (NORCO/VICODIN) 5-325 MG per tablet 1 tablet (1 tablet Oral Given 11/16/22 1021)    ED Course/ Medical Decision Making/ A&P                                 Medical Decision Making Amount and/or Complexity of Data Reviewed Radiology: ordered.  Risk Prescription  drug management.   This patient presents to the ED for concern of laceration, this involves an extensive number of treatment options, and is a complaint that carries with it a high risk of complications and morbidity.  The differential diagnosis includes fracture, strain/sprain, dislocation, ligamentous/tendinous injury, neurovascular compromise, foreign body retainment   Co morbidities that complicate the patient evaluation  See HPI   Additional history obtained:  Additional history obtained from EMR External records from outside source obtained and reviewed including hospital records   Lab Tests:  N/a   Imaging Studies ordered:  I ordered imaging studies including left elbow x-ray I independently visualized and interpreted imaging which showed laceration without evidence of foreign body I agree with the radiologist interpretation   Cardiac Monitoring: / EKG:  The patient was maintained on a cardiac monitor.  I personally viewed and interpreted the cardiac monitored which showed an underlying rhythm of: Sinus rhythm   Consultations Obtained:  N/a   Problem List / ED Course / Critical interventions / Medication management  Laceration I ordered medication including lidocaine with epinephrine, Norco, Tdap   Reevaluation of the patient after these medicines showed that the patient improved I have reviewed the patients home medicines and have made adjustments as  needed   Social Determinants of Health:  Polysubstance use.   Test / Admission - Considered:  Laceration Vitals signs within normal range and stable throughout visit. Imaging studies significant for: See above 42 year old male presents emergency department after suffering laceration to his left forearm.  On exam, patient with 2.6 cm roughly of left distal upper arm.  No obvious tendernes/muscular involvement.  Area was repaired in manner as depicted above.  Patient full range of motion as well as appreciated strength of left elbow in flexion.  Patient educated regarding proper wound care at home.  Tdap updated while in the ED.  Recommend suture removal in 10 to 14 days as well as proper wound care at home.  Treatment plan discussed at length with the patient and he acknowledged and was agreeable to said plan.  Patient overall well-appearing, afebrile in no acute distress. Worrisome signs and symptoms were discussed with the patient, and the patient acknowledged understanding to return to the ED if noticed. Patient was stable upon discharge.          Final Clinical Impression(s) / ED Diagnoses Final diagnoses:  Laceration of left upper extremity, initial encounter    Rx / DC Orders ED Discharge Orders          Ordered    cephALEXin (KEFLEX) 500 MG capsule  4 times daily        11/16/22 1146    ibuprofen (ADVIL) 600 MG tablet  Every 6 hours PRN        11/16/22 1146              Peter Garter, [place] 11/16/22 1344    Coral Spikes, DO 11/16/22 1510

## 2022-12-05 ENCOUNTER — Other Ambulatory Visit (HOSPITAL_BASED_OUTPATIENT_CLINIC_OR_DEPARTMENT_OTHER): Payer: Self-pay

## 2023-02-10 IMAGING — CR DG CHEST 2V
1 series · 2 of 2 positions shown · non-contrast
Comparison: January 14, 2020

CLINICAL DATA: Left-sided chest pain.

EXAM:
CHEST - 2 VIEW

[Series 1: dg chest 2 view · 0.14mm/px · 2 of 2 slices shown]
[im 1/2]
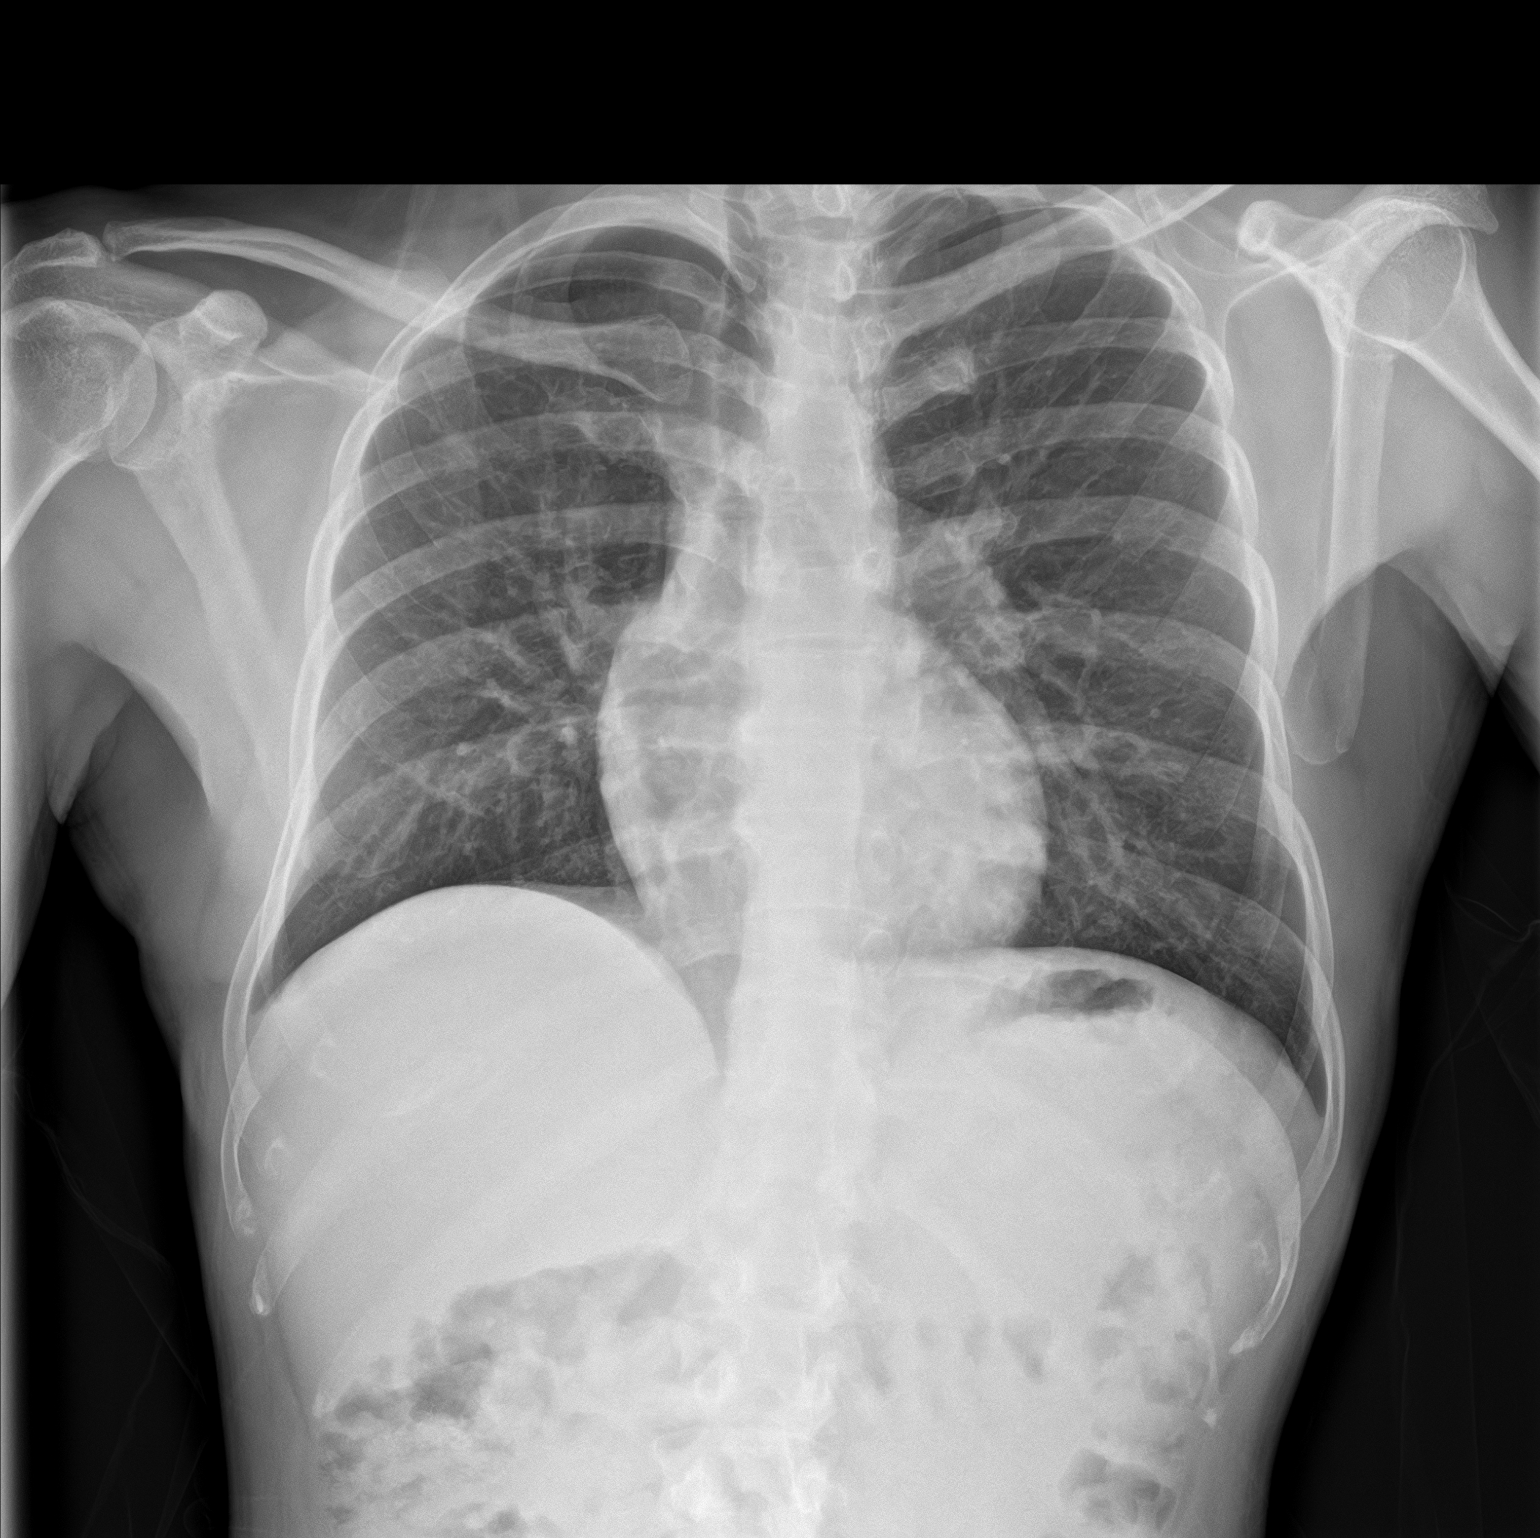
[im 2/2]
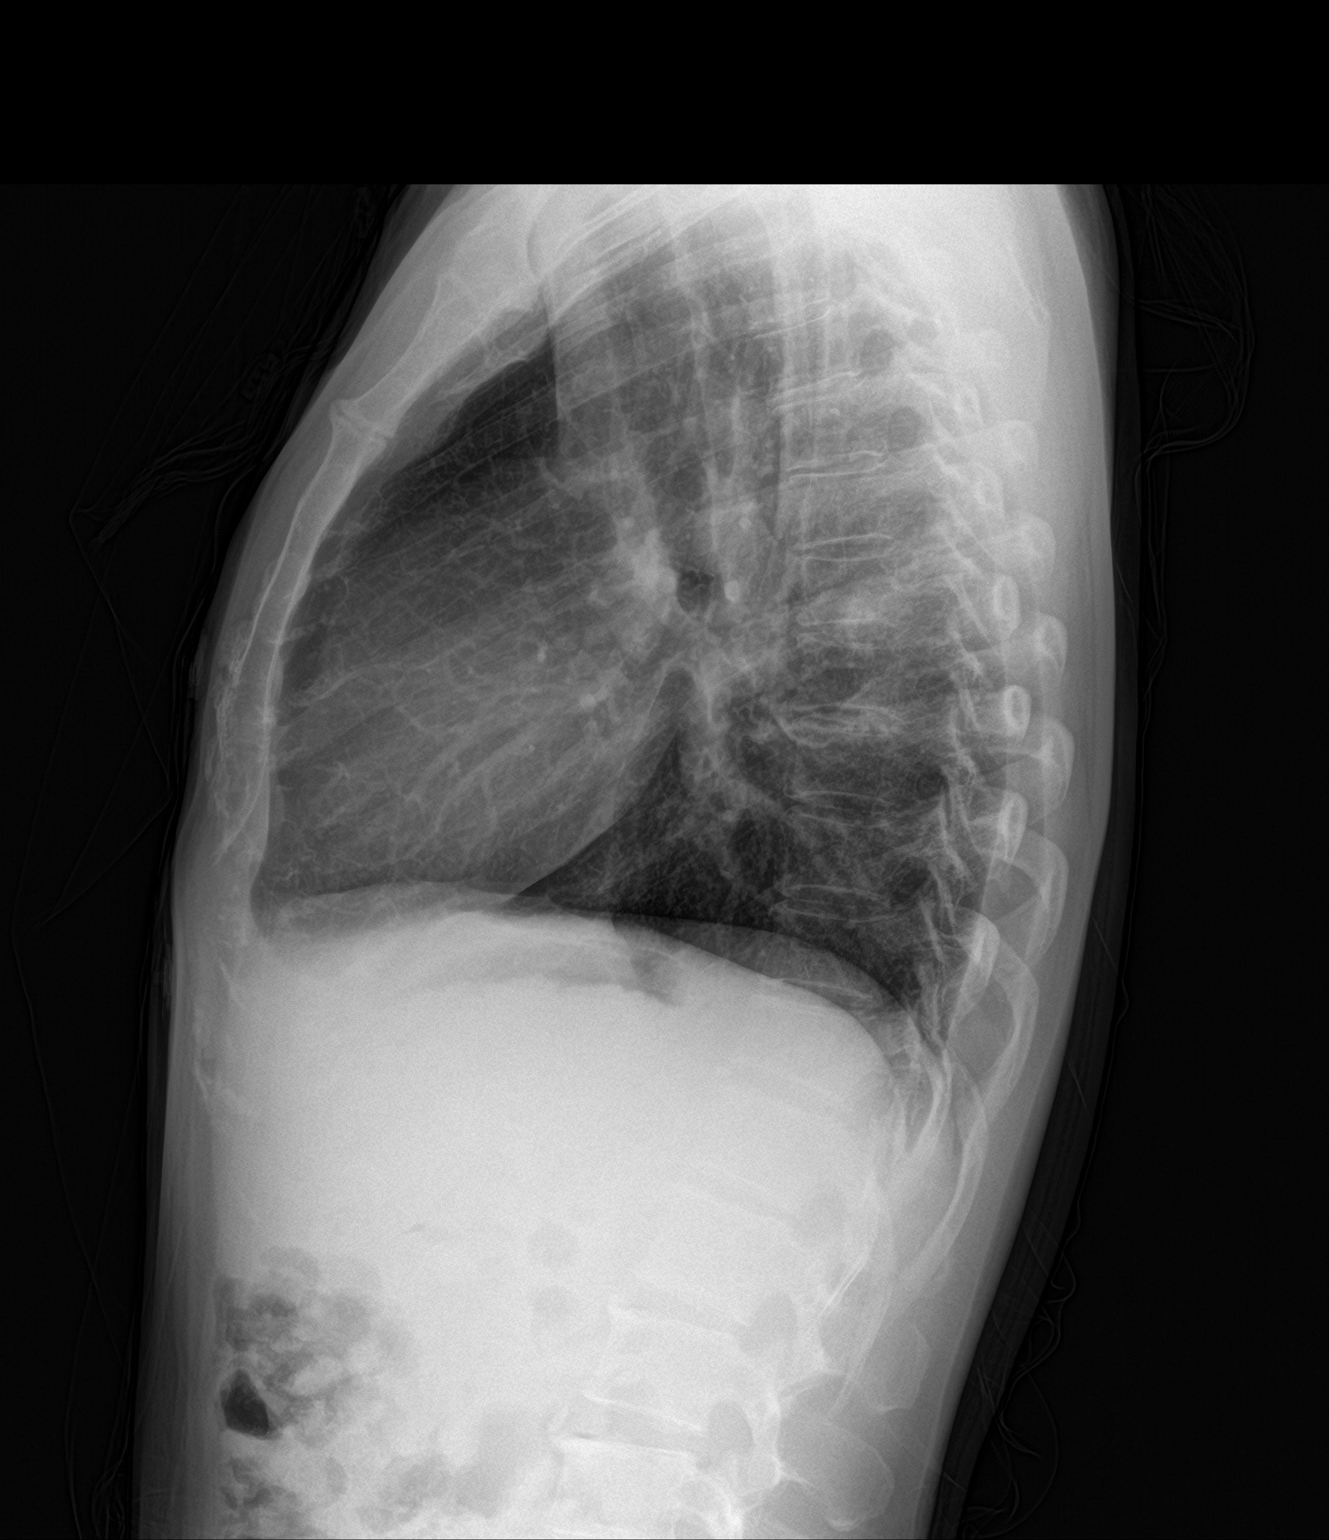

[2 of 2 positions shown; findings below may reference images not displayed]

FINDINGS: There is no evidence of acute infiltrate, pleural effusion or
pneumothorax. The heart size and mediastinal contours are within
normal limits. The visualized skeletal structures are unremarkable.
IMPRESSION: No active cardiopulmonary disease.
